# Patient Record
Sex: Female | Born: 1945 | Race: White | Hispanic: No | Smoking: Former smoker
Health system: Southern US, Community
[De-identification: ages and names within clinical notes are randomized; demographics above are authoritative.]

## PROBLEM LIST (undated history)

## (undated) DIAGNOSIS — R32 Unspecified urinary incontinence: Secondary | ICD-10-CM

## (undated) DIAGNOSIS — R918 Other nonspecific abnormal finding of lung field: Secondary | ICD-10-CM

## (undated) DIAGNOSIS — G5600 Carpal tunnel syndrome, unspecified upper limb: Secondary | ICD-10-CM

## (undated) DIAGNOSIS — R42 Dizziness and giddiness: Secondary | ICD-10-CM

## (undated) DIAGNOSIS — I499 Cardiac arrhythmia, unspecified: Secondary | ICD-10-CM

## (undated) DIAGNOSIS — I447 Left bundle-branch block, unspecified: Secondary | ICD-10-CM

## (undated) DIAGNOSIS — R112 Nausea with vomiting, unspecified: Secondary | ICD-10-CM

## (undated) DIAGNOSIS — Z8489 Family history of other specified conditions: Secondary | ICD-10-CM

## (undated) DIAGNOSIS — J449 Chronic obstructive pulmonary disease, unspecified: Secondary | ICD-10-CM

## (undated) DIAGNOSIS — R0600 Dyspnea, unspecified: Secondary | ICD-10-CM

## (undated) DIAGNOSIS — T4145XA Adverse effect of unspecified anesthetic, initial encounter: Secondary | ICD-10-CM

## (undated) DIAGNOSIS — Z8719 Personal history of other diseases of the digestive system: Secondary | ICD-10-CM

## (undated) DIAGNOSIS — I4891 Unspecified atrial fibrillation: Secondary | ICD-10-CM

## (undated) DIAGNOSIS — R0609 Other forms of dyspnea: Secondary | ICD-10-CM

## (undated) DIAGNOSIS — N6019 Diffuse cystic mastopathy of unspecified breast: Secondary | ICD-10-CM

## (undated) DIAGNOSIS — T8859XA Other complications of anesthesia, initial encounter: Secondary | ICD-10-CM

## (undated) DIAGNOSIS — J189 Pneumonia, unspecified organism: Secondary | ICD-10-CM

## (undated) DIAGNOSIS — I10 Essential (primary) hypertension: Secondary | ICD-10-CM

## (undated) DIAGNOSIS — M1990 Unspecified osteoarthritis, unspecified site: Secondary | ICD-10-CM

## (undated) DIAGNOSIS — D1803 Hemangioma of intra-abdominal structures: Secondary | ICD-10-CM

## (undated) DIAGNOSIS — G5682 Other specified mononeuropathies of left upper limb: Secondary | ICD-10-CM

## (undated) DIAGNOSIS — K219 Gastro-esophageal reflux disease without esophagitis: Secondary | ICD-10-CM

## (undated) HISTORY — DX: Dizziness and giddiness: R42

## (undated) HISTORY — PX: KNEE ARTHROSCOPY: SUR90

## (undated) HISTORY — PX: OTHER SURGICAL HISTORY: SHX169

## (undated) HISTORY — PX: ABDOMINAL HYSTERECTOMY: SHX81

## (undated) HISTORY — PX: COLONOSCOPY: SHX174

## (undated) HISTORY — PX: DIAGNOSTIC LAPAROSCOPY: SUR761

## (undated) HISTORY — PX: TUBAL LIGATION: SHX77

---
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
## 1975-12-23 HISTORY — PX: TYMPANOSTOMY TUBE PLACEMENT: SHX32

## 1977-11-21 HISTORY — PX: HEMORROIDECTOMY: SUR656

## 1986-04-21 HISTORY — PX: VAGINAL HYSTERECTOMY: SHX2639

## 1997-12-22 HISTORY — PX: KNEE SURGERY: SHX244

## 2004-12-22 HISTORY — PX: BREAST BIOPSY: SHX20

## 2005-06-27 ENCOUNTER — Ambulatory Visit: Payer: Self-pay | Admitting: General Surgery

## 2005-11-09 ENCOUNTER — Other Ambulatory Visit: Payer: Self-pay

## 2005-11-09 ENCOUNTER — Inpatient Hospital Stay: Payer: Self-pay | Admitting: Internal Medicine

## 2005-11-21 HISTORY — PX: LARYNGOSCOPY: SUR817

## 2005-11-28 ENCOUNTER — Ambulatory Visit: Payer: Self-pay | Admitting: Internal Medicine

## 2005-12-09 ENCOUNTER — Ambulatory Visit: Payer: Self-pay | Admitting: Internal Medicine

## 2005-12-12 ENCOUNTER — Ambulatory Visit: Payer: Self-pay | Admitting: Internal Medicine

## 2005-12-24 ENCOUNTER — Ambulatory Visit: Payer: Self-pay | Admitting: Otolaryngology

## 2006-01-06 ENCOUNTER — Ambulatory Visit: Payer: Self-pay | Admitting: Internal Medicine

## 2006-01-09 ENCOUNTER — Ambulatory Visit: Payer: Self-pay | Admitting: Internal Medicine

## 2006-04-06 ENCOUNTER — Ambulatory Visit: Payer: Self-pay | Admitting: Internal Medicine

## 2006-04-09 ENCOUNTER — Ambulatory Visit: Payer: Self-pay | Admitting: Internal Medicine

## 2006-04-21 ENCOUNTER — Ambulatory Visit: Payer: Self-pay | Admitting: Internal Medicine

## 2006-06-04 ENCOUNTER — Ambulatory Visit: Payer: Self-pay | Admitting: Obstetrics and Gynecology

## 2006-08-05 ENCOUNTER — Ambulatory Visit: Payer: Self-pay | Admitting: Internal Medicine

## 2006-08-13 ENCOUNTER — Ambulatory Visit: Payer: Self-pay | Admitting: Internal Medicine

## 2006-08-22 ENCOUNTER — Ambulatory Visit: Payer: Self-pay | Admitting: Internal Medicine

## 2007-02-15 ENCOUNTER — Ambulatory Visit: Payer: Self-pay | Admitting: Internal Medicine

## 2007-02-20 ENCOUNTER — Ambulatory Visit: Payer: Self-pay | Admitting: Internal Medicine

## 2007-03-23 ENCOUNTER — Ambulatory Visit: Payer: Self-pay | Admitting: Internal Medicine

## 2007-06-09 ENCOUNTER — Ambulatory Visit: Payer: Self-pay

## 2007-07-26 ENCOUNTER — Emergency Department: Payer: Self-pay | Admitting: Emergency Medicine

## 2007-07-26 ENCOUNTER — Other Ambulatory Visit: Payer: Self-pay

## 2007-10-20 ENCOUNTER — Ambulatory Visit: Payer: Self-pay | Admitting: Gastroenterology

## 2008-01-19 ENCOUNTER — Other Ambulatory Visit: Payer: Self-pay

## 2008-01-19 ENCOUNTER — Ambulatory Visit: Payer: Self-pay | Admitting: Urology

## 2008-01-24 ENCOUNTER — Inpatient Hospital Stay: Payer: Self-pay | Admitting: Obstetrics and Gynecology

## 2008-01-24 HISTORY — PX: BLADDER SUSPENSION: SHX72

## 2008-01-24 HISTORY — PX: HERNIA REPAIR: SHX51

## 2008-03-06 ENCOUNTER — Ambulatory Visit: Payer: Self-pay | Admitting: Internal Medicine

## 2008-06-12 ENCOUNTER — Ambulatory Visit: Payer: Self-pay | Admitting: Obstetrics and Gynecology

## 2008-08-09 ENCOUNTER — Inpatient Hospital Stay: Payer: Self-pay | Admitting: Internal Medicine

## 2008-08-09 ENCOUNTER — Other Ambulatory Visit: Payer: Self-pay

## 2008-08-16 ENCOUNTER — Ambulatory Visit: Payer: Self-pay | Admitting: Internal Medicine

## 2008-08-22 HISTORY — PX: CHOLECYSTECTOMY: SHX55

## 2008-09-05 ENCOUNTER — Ambulatory Visit: Payer: Self-pay | Admitting: Surgery

## 2009-06-14 ENCOUNTER — Ambulatory Visit: Payer: Self-pay | Admitting: Obstetrics and Gynecology

## 2010-03-22 ENCOUNTER — Ambulatory Visit: Payer: Self-pay | Admitting: Internal Medicine

## 2010-04-02 ENCOUNTER — Emergency Department: Payer: Self-pay | Admitting: Emergency Medicine

## 2010-05-31 ENCOUNTER — Emergency Department: Payer: Self-pay | Admitting: Unknown Physician Specialty

## 2010-06-19 ENCOUNTER — Ambulatory Visit: Payer: Self-pay | Admitting: Obstetrics and Gynecology

## 2010-06-29 ENCOUNTER — Ambulatory Visit: Payer: Self-pay | Admitting: Internal Medicine

## 2011-06-23 ENCOUNTER — Ambulatory Visit: Payer: Self-pay | Admitting: Obstetrics and Gynecology

## 2011-12-01 ENCOUNTER — Ambulatory Visit: Payer: Self-pay | Admitting: Internal Medicine

## 2012-06-29 ENCOUNTER — Ambulatory Visit: Payer: Self-pay | Admitting: Internal Medicine

## 2012-11-11 DIAGNOSIS — N3941 Urge incontinence: Secondary | ICD-10-CM | POA: Insufficient documentation

## 2012-11-11 DIAGNOSIS — N393 Stress incontinence (female) (male): Secondary | ICD-10-CM | POA: Insufficient documentation

## 2012-11-11 DIAGNOSIS — N319 Neuromuscular dysfunction of bladder, unspecified: Secondary | ICD-10-CM | POA: Insufficient documentation

## 2012-11-11 DIAGNOSIS — R339 Retention of urine, unspecified: Secondary | ICD-10-CM | POA: Insufficient documentation

## 2012-11-11 DIAGNOSIS — N816 Rectocele: Secondary | ICD-10-CM | POA: Insufficient documentation

## 2012-12-31 ENCOUNTER — Ambulatory Visit: Payer: Self-pay | Admitting: Gastroenterology

## 2013-01-16 ENCOUNTER — Emergency Department: Payer: Self-pay | Admitting: Internal Medicine

## 2013-02-16 ENCOUNTER — Ambulatory Visit: Payer: Self-pay | Admitting: Orthopedic Surgery

## 2013-06-08 ENCOUNTER — Ambulatory Visit: Payer: Self-pay | Admitting: Ophthalmology

## 2013-06-14 ENCOUNTER — Ambulatory Visit: Payer: Self-pay | Admitting: Ophthalmology

## 2013-06-14 HISTORY — PX: CATARACT EXTRACTION: SUR2

## 2013-06-28 ENCOUNTER — Ambulatory Visit: Payer: Self-pay | Admitting: Ophthalmology

## 2013-06-28 HISTORY — PX: CATARACT EXTRACTION: SUR2

## 2013-07-06 ENCOUNTER — Ambulatory Visit: Payer: Self-pay | Admitting: Internal Medicine

## 2013-09-15 ENCOUNTER — Ambulatory Visit: Payer: Self-pay | Admitting: Internal Medicine

## 2013-09-21 ENCOUNTER — Ambulatory Visit: Payer: Self-pay | Admitting: Internal Medicine

## 2013-11-21 ENCOUNTER — Ambulatory Visit: Payer: Self-pay | Admitting: Internal Medicine

## 2013-12-22 ENCOUNTER — Ambulatory Visit: Payer: Self-pay | Admitting: Internal Medicine

## 2014-07-18 ENCOUNTER — Ambulatory Visit: Payer: Self-pay | Admitting: Internal Medicine

## 2014-09-07 ENCOUNTER — Ambulatory Visit: Payer: Self-pay | Admitting: Podiatry

## 2014-09-07 HISTORY — PX: FOOT SURGERY: SHX648

## 2014-10-16 DIAGNOSIS — I4719 Other supraventricular tachycardia: Secondary | ICD-10-CM | POA: Insufficient documentation

## 2014-10-16 DIAGNOSIS — E782 Mixed hyperlipidemia: Secondary | ICD-10-CM | POA: Insufficient documentation

## 2014-10-16 DIAGNOSIS — D1803 Hemangioma of intra-abdominal structures: Secondary | ICD-10-CM | POA: Insufficient documentation

## 2014-10-16 DIAGNOSIS — I471 Supraventricular tachycardia: Secondary | ICD-10-CM | POA: Insufficient documentation

## 2014-10-16 DIAGNOSIS — K219 Gastro-esophageal reflux disease without esophagitis: Secondary | ICD-10-CM | POA: Insufficient documentation

## 2015-04-13 NOTE — Op Note (Signed)
PATIENT NAME:  Kimberly Page, Kimberly Page MR#:  [id number] DATE OF BIRTH:  1946-11-17  DATE OF PROCEDURE:  06/14/2013  PREOPERATIVE DIAGNOSIS: Visually significant cataract of the left eye.   POSTOPERATIVE DIAGNOSIS: Visually significant cataract of the left eye.   OPERATIVE PROCEDURE: Cataract extraction by phacoemulsification with implant of intraocular lens to left eye.   SURGEON: Birder Robson, MD.   ANESTHESIA:  1. Managed anesthesia care.  2. Topical tetracaine drops followed by 2% Xylocaine jelly applied in the preoperative holding area.   COMPLICATIONS: None.   TECHNIQUE:  Stop and chop.   DESCRIPTION OF PROCEDURE: The patient was examined and consented in the preoperative holding area where the aforementioned topical anesthesia was applied to the left eye and then brought back to the Operating Room where the left eye was prepped and draped in the usual sterile ophthalmic fashion and a lid speculum was placed. A paracentesis was created with the side port blade and the anterior chamber was filled with viscoelastic. A near clear corneal incision was performed with the steel keratome. A continuous curvilinear capsulorrhexis was performed with a cystotome followed by the capsulorrhexis forceps. Hydrodissection and hydrodelineation were carried out with BSS on a blunt cannula. The lens was removed in a stop and chop technique and the remaining cortical material was removed with the irrigation-aspiration handpiece. The capsular bag was inflated with viscoelastic and the Tecnis ZCB00 29.5-diopter lens, serial number [id number] was placed in the capsular bag without complication. The remaining viscoelastic was removed from the eye with the irrigation-aspiration handpiece. The wounds were hydrated. The anterior chamber was flushed with Miostat and the eye was inflated to physiologic pressure. 0.1 mL of cefuroxime concentration 10 mg/mL was placed in the anterior chamber. The wounds were found to be water  tight. The eye was dressed with Vigamox and Combigan. The patient was given protective glasses to wear throughout the day and a shield with which to sleep tonight. The patient was also given drops with which to begin a drop regimen today and will follow-up with me in one day.     ____________________________ Livingston Diones. Elenor Wildes, MD wlp:sb D: 06/14/2013 13:00:00 ET T: 06/14/2013 13:16:20 ET JOB#: [id number]  cc: Sebastin Perlmutter L. Aliese Brannum, MD, <Dictator> Livingston Diones Kayin Kettering MD ELECTRONICALLY SIGNED 06/16/2013 9:43

## 2015-10-22 ENCOUNTER — Other Ambulatory Visit: Payer: Self-pay | Admitting: Internal Medicine

## 2015-10-22 DIAGNOSIS — M5440 Lumbago with sciatica, unspecified side: Secondary | ICD-10-CM

## 2015-11-02 ENCOUNTER — Ambulatory Visit
Admission: RE | Admit: 2015-11-02 | Discharge: 2015-11-02 | Disposition: A | Discharge status: Home / Self Care | Payer: Medicare Other | Source: Ambulatory Visit | Attending: Internal Medicine | Admitting: Internal Medicine

## 2015-11-02 DIAGNOSIS — M5116 Intervertebral disc disorders with radiculopathy, lumbar region: Secondary | ICD-10-CM | POA: Diagnosis present

## 2015-11-02 DIAGNOSIS — M4687 Other specified inflammatory spondylopathies, lumbosacral region: Secondary | ICD-10-CM | POA: Diagnosis not present

## 2015-11-02 DIAGNOSIS — M5440 Lumbago with sciatica, unspecified side: Secondary | ICD-10-CM

## 2015-11-21 DIAGNOSIS — D510 Vitamin B12 deficiency anemia due to intrinsic factor deficiency: Secondary | ICD-10-CM | POA: Insufficient documentation

## 2016-05-06 ENCOUNTER — Other Ambulatory Visit: Payer: Self-pay | Admitting: Internal Medicine

## 2016-05-06 DIAGNOSIS — M5116 Intervertebral disc disorders with radiculopathy, lumbar region: Secondary | ICD-10-CM

## 2016-05-26 ENCOUNTER — Ambulatory Visit
Admission: RE | Admit: 2016-05-26 | Discharge: 2016-05-26 | Disposition: A | Discharge status: Home / Self Care | Payer: Medicare Other | Source: Ambulatory Visit | Attending: Internal Medicine | Admitting: Internal Medicine

## 2016-05-26 DIAGNOSIS — M5116 Intervertebral disc disorders with radiculopathy, lumbar region: Secondary | ICD-10-CM | POA: Insufficient documentation

## 2016-06-04 ENCOUNTER — Encounter: Payer: Self-pay | Admitting: Neurology

## 2016-06-04 ENCOUNTER — Ambulatory Visit (INDEPENDENT_AMBULATORY_CARE_PROVIDER_SITE_OTHER): Payer: Medicare Other | Admitting: Neurology

## 2016-06-04 VITALS — BP 115/62 | HR 61 | Resp 12 | Wt 176.2 lb

## 2016-06-04 DIAGNOSIS — M5416 Radiculopathy, lumbar region: Secondary | ICD-10-CM

## 2016-06-04 DIAGNOSIS — M5417 Radiculopathy, lumbosacral region: Secondary | ICD-10-CM | POA: Diagnosis not present

## 2016-06-04 NOTE — Progress Notes (Addendum)
[facility]    Provider:  Dr Jaynee Eagles Referring Provider: Rusty Aus, MD Primary Care Physician:  Rusty Aus, MD  CC:  Right leg pain  HPI:  Kimberly Page is a 70 y.o. female here as a referral from Dr. Sabra Heck for right leg pain. PMHx CTS, COPD, Tobacco use, chronic leg pain. She had foot surgery in 2015 and since then she has numbness in the foot from the surgery and pain shooting through the toes and cramps. On the dorsal surface, 3/4 of the top of the foot is numb. MIddle and lower back pain. Sitting for a long time trouble getting up. Numbness in butt when sitting. tingling in the groin. Leg cramps. A neurologist told her they damaged her sciatic nerve on lumbar puncture before the foot surgery. Calf sore. She has a sore on her anterior lower leg and says this is painful. The back pain stays local in the back no radicular symptoms. If she stands too long like making dinner her low back hurts. Cramps in the legs in bed and when sitting in recliner. She a;leady had 2 emg/ncs. She went to [facility] neurology clinic.   Reviewed notes, labs and imaging from outside physicians, which showed:  Personally reviewed images of MRi of the lumbar spine and agree with the following: 05/26/2016: T10-11: Minimal bulge.  T11-12: Small Schmorl's node deformity. Mild bulge. Mild narrowing ventral thecal sac. Minimal posterior cord deflection.  T12-L1: Minimal retrolisthesis. Minimal Schmorl's node deformity. Minimal bulge.  L1-2: Minimal Schmorl's node deformity. Minimal bulge.  L2-3: Minimal bulge. Minimal facet degenerative changes.  L3-4: Mild to moderate facet degenerative changes. Bulge. Minimal anterior slips L3. Minimal spinal stenosis.  L4-5: Moderate facet degenerative changes and ligamentum flavum hypertrophy greater on the left. Bulge slightly greater to left. Multifactorial mild spinal stenosis. Mild foraminal narrowing greater on the left.  L5-S1:  Moderate facet degenerative changes. Minimal bulge. No significant spinal stenosis or foraminal narrowing.  IMPRESSION: No significant change from prior examination as detailed above. Overall, no significant right-sided nerve root compression or high-grade spinal stenosis detected as cause of patient's symptoms.  Review of Systems: Patient complains of symptoms per HPI as well as the following symptoms: No fevers, no chills. Endorses leg pain and numbness. Pertinent negatives per HPI. All others negative.   Social History   Social History  . Marital Status: Widowed    Spouse Name: N/A  . Number of Children: 1  . Years of Education: 12   Occupational History  . [employer]    Social History Main Topics  . Smoking status: Former Smoker    Quit date: 12/23/1999  . Smokeless tobacco: Not on file  . Alcohol Use: No  . Drug Use: No  . Sexual Activity: Not on file   Other Topics Concern  . Not on file   Social History Narrative   Lives alone   Caffeine use:  none    Family History  Problem Relation Age of Onset  . Cancer      Past Medical History  Diagnosis Date  . Vertigo     Past Surgical History  Procedure Laterality Date  . Foot surgery Right 09/07/14  . Cataract extraction Left 06/14/2013  . Cataract extraction Right 06/28/2013  . Cholecystectomy  08/2008  . Hernia repair  01/24/2008  . Bladder suspension  01/24/2008  . Laryngoscopy  11/2005  . Breast biopsy Left 12/2004  . Knee surgery Left 12/1997  . Vaginal hysterectomy  04/1986  .  Hemorroidectomy  11/1977  . Tympanostomy tube placement  1977  . Eye surgery Right 1958    Current Outpatient Prescriptions  Medication Sig Dispense Refill  . aspirin 81 MG tablet Take 81 mg by mouth daily.    Marland Kitchen CALCIUM-VITAMIN D PO Take 600 mg by mouth 2 (two) times daily.    . Cyanocobalamin (VITAMIN B-12 PO) Take 1 tablet by mouth daily.    . Magnesium 250 MG TABS Take 1 tablet by mouth daily. With Lunch    .  meclizine (ANTIVERT) 25 MG tablet Take 25 mg by mouth as needed for dizziness.    . metoprolol succinate (TOPROL-XL) 50 MG 24 hr tablet Take 50 mg by mouth 2 (two) times daily. Take with or immediately following a meal. Per patient: 1 tab in morning and 1 tab at night    . oxybutynin (DITROPAN) 5 MG tablet Take 5 mg by mouth 2 (two) times daily.    . pantoprazole (PROTONIX) 40 MG tablet Take 40 mg by mouth daily.    . vitamin E 200 UNIT capsule Take 200 Units by mouth 4 (four) times a week.     No current facility-administered medications for this visit.    Allergies as of 06/04/2016 - Review Complete 06/04/2016  Allergen Reaction Noted  . Naproxen Other (See Comments) 06/04/2016  . Propoxyphene Other (See Comments) 06/04/2016  . Pseudoephedrine hcl Palpitations 06/04/2016    Vitals: BP 115/62 mmHg  Pulse 61  Wt 176 lb 3.2 oz (79.924 kg)  SpO2 95% Last Weight:  Wt Readings from Last 1 Encounters:  06/04/16 176 lb 3.2 oz (79.924 kg)   Last Height:   Ht Readings from Last 1 Encounters:  No data found for Ht    Physical exam: Exam: Gen: NAD, conversant, well nourised, obese, well groomed                     CV: RRR, no MRG. No Carotid Bruits. No peripheral edema, warm, nontender Eyes: Conjunctivae clear without exudates or hemorrhage  Neuro: Detailed Neurologic Exam  Speech:    Speech is normal; fluent and spontaneous with normal comprehension.  Cognition:    The patient is oriented to person, place, and time;     recent and remote memory intact;     language fluent;     normal attention, concentration,     fund of knowledge Cranial Nerves:    The pupils are equal, round, and reactive to light. Attempted fundoscopic examBut could not visualize due to small pupils.  Visual fields are full to finger confrontation. Extraocular movements are intact. Trigeminal sensation is intact and the muscles of mastication are normal. The face is symmetric. The palate elevates in the  midline. Hearing intact. Voice is normal. Shoulder shrug is normal. The tongue has normal motion without fasciculations.   Coordination:    Normal finger to nose and heel to shin.  Gait:    Heel-toe with some imbalance.   Motor Observation:    No asymmetry, no atrophy, and no involuntary movements noted. Tone:    Normal muscle tone.    Posture:    Posture is normal. normal erect    Strength: right dorsiflexion weakness, had to encourage patient to give me full strength becaue she said touching it there hurt. Otherwise  Strength is V/V in the upper and lower limbs.      Sensation: decreased pin prick right foot dorsal surface and lateral right foot and lower leg.  Reflex Exam:  DTR's: right absent ankle jerk otherwise deep tendon reflexes in the upper and lower extremities are normal bilaterally.   Toes:    The toes are downgoing bilaterally.   Clonus:    Clonus is absent.      Assessment/Plan:  70 year old female with chronic right leg weakness and sensory changes. She has sensory changes in the right dorsal surface of the foot and right lateral flex and lateral lower leg, mild weakness of dorsiflexion, absent right ankle jerk. Exam onsistent with possibly remote L5-S1 nerve root impingement. Patient has been extensively evaluated by another neurologist and had multiple EMG nerve conduction studies. I can request her records and review.  - she has low back pain without radiculopathy, she has degenerative disease on the last MRI of the lumbar spine  which may be causing the low back pain, she is seeing an orthopaedist hopefully they can do facet blocks or other pain procedures. - her foot numbness maybe due to local nerve injury as it happened right after foot surgery or due to radiculopathy.  - cannot rule out sciatic involvement but she has already had imaging, emg/ncs x 2. -  Patient has been extensively evaluated by another neurologist and had multiple EMG nerve conduction  studies. I can request her records and review. If there is any more that I can offer patient after I review all the records I will contact patient.    Reviewed notes from previous neurologist at [facility]: EMG nerve conduction study was completed May 2016: Nerve conductions were completed on the bilateral lower extremities and EMG in the right lower extremity. Evaluation of the right tibial motor nerve showed decreased conduction velocity 40 m/s needed. The right sural sensory nerve showed prolonged distal peak latency 4.2 ms and decreased conduction velocity 33 m/s To lateral malleolus. All remaining nerves were normal . EMG of the right gastroc showed mildly decreased recruitment, anterior tibialis showed mildly decreased recruitment in, gluteus maximus and, biceps femoris (long and Alcalde head) with a long #mildly decreased recruitment, peroneus longus showed mildly decreased recruitment, lumbosacral paraspinal lower and mid muscles L3-L4 and L5-S1 did not show acute ongoing denervation; consistent with right sciatic neuropathy leg proximal thigh, no evidence of lumbar radiculopathy  Repeat EMG nerve conduction study in October 2016 showed interval improvement in right sciatic neuropathy. 01/26/2015, resolution of neuropathic changes on EMG in the right anterior tibialis, gastrocnemius and Wenker head of the biceps femoris.  The patient was also seen in clinic: The Patient has known B12 deficiency 187 in October 2016 and is receiving B12 injections. Patient was seen in clinic due to her right foot numbness since September 2013 following surgery consisting of right foot fifth metatarsal osteotomy and hallux valgus repair with general anesthesia popliteal block. II nerve conduction studies since then were completed. The first showed right-sided sciatic nerve changes with changes in the right foot with toe curling. The second nerve conduction study. Improvement in the gastrocnemius tibialis anterior. Subjectively  patient still has a lot of pain for which she occasionally takes Aleve) and she has been in her feet a lot a particular day. She also endorses numbness weakness and foot cramps at night if she flexes them. Suddenly and slowly getting worse. Taking her she walked makes her symptoms worse. She is not diabetic and does not have hypothyroidism. During the exam with physician which was 11/11/2015 patient became emotional and began crying. She was crying due to the many symptoms after surgery to relieve her  foot trouble. She was also feeling depressed due to her recent family death. Exam was significant for absent right shoulder left ankle jerk 1+. Right toe curling 45 with pain, V sensation of the right foot dorsal surface, sensations were decreased in the bilateral lower extremities, decreased vibration, temperature sense and proprioception. Gait and station are wide based and mildly antalgic, positive Romberg.  MRI of the lumbar spine showed slightly progressed moderately severe bilateral facet arthritis at L4-5 S1, chronic left lateral disc bulge at L4-L5 with out neural impingement, moderate bilateral facet arthritis at L4-L5 slightly progressed. MRI was completed November 2016.  Gabapentin was suggested. Patient felt as though her symptoms were not bad in declined. B12 injections were best follow-up in 2 months for numbness and tingling. No further workup was recommended. She was last seen in February 2017. She was diagnosed with right sciatic neuropathy, focal mononeuropathy which was improved and repeat EMG nerve conduction study and no further workup was recommended. Neurontin was recommended. She was asked to return if symptoms worsen or fail to improve.  Sarina Ill, MD  [facility] [address] [place],  [address]  Phone [phone] Fax [phone]

## 2016-06-04 NOTE — Patient Instructions (Signed)

## 2016-06-05 DIAGNOSIS — M5417 Radiculopathy, lumbosacral region: Secondary | ICD-10-CM

## 2016-06-06 ENCOUNTER — Telehealth: Payer: Self-pay | Admitting: *Deleted

## 2016-06-06 NOTE — Telephone Encounter (Signed)
Patient returned Kimberly Page's call.

## 2016-06-06 NOTE — Telephone Encounter (Signed)
A record request faxed over to [facility] requesting all records.

## 2016-07-15 NOTE — Telephone Encounter (Signed)
Received medical records from  [facility] desk.

## 2016-09-09 ENCOUNTER — Encounter: Payer: Self-pay | Admitting: Emergency Medicine

## 2016-09-09 ENCOUNTER — Emergency Department
Admission: EM | Admit: 2016-09-09 | Discharge: 2016-09-09 | Disposition: A | Discharge status: Home / Self Care | Payer: Medicare Other | Attending: Emergency Medicine | Admitting: Emergency Medicine

## 2016-09-09 ENCOUNTER — Emergency Department: Payer: Medicare Other

## 2016-09-09 DIAGNOSIS — R0789 Other chest pain: Secondary | ICD-10-CM

## 2016-09-09 DIAGNOSIS — Z79899 Other long term (current) drug therapy: Secondary | ICD-10-CM | POA: Insufficient documentation

## 2016-09-09 DIAGNOSIS — Z7982 Long term (current) use of aspirin: Secondary | ICD-10-CM | POA: Diagnosis not present

## 2016-09-09 DIAGNOSIS — Z87891 Personal history of nicotine dependence: Secondary | ICD-10-CM | POA: Insufficient documentation

## 2016-09-09 DIAGNOSIS — R067 Sneezing: Secondary | ICD-10-CM

## 2016-09-09 NOTE — ED Provider Notes (Addendum)
[facility] Emergency Department Provider Note  ____________________________________________   I have reviewed the triage vital signs and the nursing notes.   HISTORY  Chief Complaint Chest Pain    HPI Kimberly Page is a 70 y.o. female who presents today complaining of pain in the left chest wall after sneezing. She woke up and had a sneezing fit she states that sometimes she does this when she wakes up. Immediately after stating she noticed that she had "pulled muscle" in the left chest wall. She has tenderness to movement and palpation left chest wall. She denies shortness of breath nausea vomiting she denies any other discomfort. She has pain in the left chest wall and in the trapezius muscle. This began with a violent sneeze and she does not feel that she is having difficulty getting air in and out The pain is sharp, stating still makes it better, moving the wrong way or taking a deep breath or otherwise moving those muscles makes it worse. This started at approximately 6:15 this morning when she first woke up.     Past Medical History:  Diagnosis Date  . Vertigo     Patient Active Problem List   Diagnosis Date Noted  . Lumbosacral radiculopathy 06/05/2016    Past Surgical History:  Procedure Laterality Date  . BLADDER SUSPENSION  01/24/2008  . BREAST BIOPSY Left 12/2004  . CATARACT EXTRACTION Left 06/14/2013  . CATARACT EXTRACTION Right 06/28/2013  . CHOLECYSTECTOMY  08/2008  . EYE SURGERY Right 1958  . FOOT SURGERY Right 09/07/14  . HEMORROIDECTOMY  11/1977  . HERNIA REPAIR  01/24/2008  . KNEE SURGERY Left 12/1997  . LARYNGOSCOPY  11/2005  . TYMPANOSTOMY TUBE PLACEMENT  1977  . VAGINAL HYSTERECTOMY  04/1986    Prior to Admission medications   Medication Sig Start Date End Date Taking? Authorizing Provider  aspirin 81 MG tablet Take 81 mg by mouth daily.    Historical Provider, MD  CALCIUM-VITAMIN D PO Take 600 mg by mouth 2 (two) times  daily.    Historical Provider, MD  Cyanocobalamin (VITAMIN B-12 PO) Take 1 tablet by mouth daily.    Historical Provider, MD  Magnesium 250 MG TABS Take 1 tablet by mouth daily. With Lunch    Historical Provider, MD  meclizine (ANTIVERT) 25 MG tablet Take 25 mg by mouth as needed for dizziness.    Historical Provider, MD  metoprolol succinate (TOPROL-XL) 50 MG 24 hr tablet Take 50 mg by mouth 2 (two) times daily. Take with or immediately following a meal. Per patient: 1 tab in morning and 1 tab at night    Historical Provider, MD  oxybutynin (DITROPAN) 5 MG tablet Take 5 mg by mouth 2 (two) times daily.    Historical Provider, MD  pantoprazole (PROTONIX) 40 MG tablet Take 40 mg by mouth daily.    Historical Provider, MD  vitamin E 200 UNIT capsule Take 200 Units by mouth 4 (four) times a week.    Historical Provider, MD    Allergies Naproxen; Propoxyphene; and Pseudoephedrine hcl  Family History  Problem Relation Age of Onset  . Cancer      Social History Social History  Substance Use Topics  . Smoking status: Former Smoker    Quit date: 12/23/1999  . Smokeless tobacco: Never Used  . Alcohol use No    Review of Systems Constitutional: No fever/chills Eyes: No visual changes. ENT: No sore throat. No stiff neck no neck pain Cardiovascular: See history of  present illness Respiratory: Denies shortness of breath. Gastrointestinal:   no vomiting.  No diarrhea.  No constipation. Genitourinary: Negative for dysuria. Musculoskeletal: Negative lower extremity swelling Skin: Negative for rash. Neurological: Negative for severe headaches, focal weakness or numbness. 10-point ROS otherwise negative.  ____________________________________________   PHYSICAL EXAM:  VITAL SIGNS: ED Triage Vitals  Enc Vitals Group     BP 09/09/16 0643 (!) 176/73     Pulse Rate 09/09/16 0643 77     Resp 09/09/16 0643 18     Temp 09/09/16 0643 97.7 F (36.5 C)     Temp Source 09/09/16 0643 Oral      SpO2 09/09/16 0643 98 %     Weight 09/09/16 0644 175 lb (79.4 kg)     Height 09/09/16 0644 5\' 5"  (1.651 m)     Head Circumference --      Peak Flow --      Pain Score --      Pain Loc --      Pain Edu? --      Excl. in [place]? --     Constitutional: Alert and oriented. Well appearing and in no acute distress. Eyes: Conjunctivae are normal. PERRL. EOMI. Head: Atraumatic. Nose: No congestion/rhinnorhea. Mouth/Throat: Mucous membranes are moist.  Oropharynx non-erythematous. Neck: No stridor.   Nontender with no meningismus Cardiovascular: Normal rate, regular rhythm. Grossly normal heart sounds.  Good peripheral circulation. Chest: Tender to palpation left chest wall and I palpate this area patient states "ouch that's the pain right there" and pulls back. There is no flail chest there is no rib fracture palpated. There is no crepitus. Patient also has reproducible mild tenderness to the mid belly of the trapezius muscle on the left as well. She also has minimal discomfort when pulling up in the bed but is able to do so on the left. Respiratory: Normal respiratory effort.  No retractions. Lungs CTAB. Abdominal: Soft and nontender. No distention. No guarding no rebound Back:  There is no focal tenderness or step off.  there is no midline tenderness there are no lesions noted. there is no CVA tenderness Musculoskeletal: No lower extremity tenderness, no upper extremity tenderness. No joint effusions, no DVT signs strong distal pulses no edema Neurologic:  Normal speech and language. No gross focal neurologic deficits are appreciated.  Skin:  Skin is warm, dry and intact. No rash noted. Psychiatric: Mood and affect are normal. Speech and behavior are normal.  ____________________________________________   LABS (all labs ordered are listed, but only abnormal results are displayed)  Labs Reviewed  BASIC METABOLIC PANEL  CBC  TROPONIN I    ____________________________________________  EKG  I personally interpreted any EKGs ordered by me or triage Normal sinus rhythm rate 79 bpm no acute ST elevation or acute ST depression LAD noted. No acute ischemia indicated ____________________________________________  RADIOLOGY  I reviewed any imaging ordered by me or triage that were performed during my shift and, if possible, patient and/or family made aware of any abnormal findings. ____________________________________________   PROCEDURES  Procedure(s) performed: None  Procedures  Critical Care performed: None  ____________________________________________   INITIAL IMPRESSION / ASSESSMENT AND PLAN / ED COURSE  Pertinent labs & imaging results that were available during my care of the patient were reviewed by me and considered in my medical decision making (see chart for details). Patient presents with very reproducible chest wall pain after a sneeze. Patient does not appear to be otherwise ill. Pneumothorax is certainly a consideration for this  pathology but she has good breath sounds bilaterally no evidence respiratory distress at this time. We will obtain a PA and lateral chest x-ray to evaluate for that entity. One could also have Boerhaave's or other intrathoracic viscous disruption but again low suspicion given the patient's presentation. Her presentation is really most consistent with pulled muscles. However, we will evaluate the PA and lateral chest x-ray and reassess. The patient is in no acute distress. Unfortunately there is documented a allergy to naproxen. It made her "pass out" according to the note. Difficult to understand what that might mean, but we will avoid that medication.   ----------------------------------------- 8:02 AM on 09/09/2016 -----------------------------------------  At this time, there does not appear to be clinical evidence to support the diagnosis of pulmonary embolus, dissection,  myocarditis, endocarditis, pericarditis, pericardial tamponade, acute coronary syndrome, pneumothorax, pneumonia, or any other acute intrathoracic pathology that will require admission or acute intervention. Nor is there evidence of any significant intra-abdominal pathology causing this discomfort. Patient very well-appearing laughing and joking with me in the room. She does take Tylenol and Aleve at home for pain. She does not wish anything for pain right now. She will take her home pain medications when necessary. I do not think the patient requires further workup for this but return precautions and follow-up given and understood.  Clinical Course   ____________________________________________   FINAL CLINICAL IMPRESSION(S) / ED DIAGNOSES  Final diagnoses:  None      This chart was dictated using voice recognition software.  Despite best efforts to proofread,  errors can occur which can change meaning.      Schuyler Amor, MD 09/09/16 [id number]    Schuyler Amor, MD 09/09/16 [id number]    Schuyler Amor, MD 09/09/16 [phone]

## 2016-09-09 NOTE — ED Notes (Signed)
Pt reports left sided chest and back pain since this am

## 2016-09-09 NOTE — ED Triage Notes (Signed)
Pt presents to ED with c/o generalized chest tightness with shortness of breath and nausea. Pt states she woke up this morning got up to start her day. Pt states she sneezed 4-5 times and had a sudden onset of chest tightness and pain to her left shoulder. Pain increases when taking a deep breath. Pt currently has slight increased work of breathing noted at this time.

## 2016-09-25 ENCOUNTER — Emergency Department: Payer: Medicare Other

## 2016-09-25 ENCOUNTER — Emergency Department
Admission: EM | Admit: 2016-09-25 | Discharge: 2016-09-25 | Disposition: A | Discharge status: Home / Self Care | Payer: Medicare Other | Attending: Emergency Medicine | Admitting: Emergency Medicine

## 2016-09-25 ENCOUNTER — Encounter: Payer: Self-pay | Admitting: Emergency Medicine

## 2016-09-25 DIAGNOSIS — W19XXXA Unspecified fall, initial encounter: Secondary | ICD-10-CM

## 2016-09-25 DIAGNOSIS — Y990 Civilian activity done for income or pay: Secondary | ICD-10-CM | POA: Insufficient documentation

## 2016-09-25 DIAGNOSIS — Z79899 Other long term (current) drug therapy: Secondary | ICD-10-CM | POA: Insufficient documentation

## 2016-09-25 DIAGNOSIS — Z7982 Long term (current) use of aspirin: Secondary | ICD-10-CM | POA: Diagnosis not present

## 2016-09-25 DIAGNOSIS — W1839XA Other fall on same level, initial encounter: Secondary | ICD-10-CM | POA: Insufficient documentation

## 2016-09-25 DIAGNOSIS — M25551 Pain in right hip: Secondary | ICD-10-CM

## 2016-09-25 DIAGNOSIS — Y9269 Other specified industrial and construction area as the place of occurrence of the external cause: Secondary | ICD-10-CM | POA: Insufficient documentation

## 2016-09-25 DIAGNOSIS — S8991XA Unspecified injury of right lower leg, initial encounter: Secondary | ICD-10-CM | POA: Diagnosis present

## 2016-09-25 DIAGNOSIS — S82044A Nondisplaced comminuted fracture of right patella, initial encounter for closed fracture: Secondary | ICD-10-CM | POA: Insufficient documentation

## 2016-09-25 DIAGNOSIS — Z87891 Personal history of nicotine dependence: Secondary | ICD-10-CM | POA: Insufficient documentation

## 2016-09-25 DIAGNOSIS — S82001A Unspecified fracture of right patella, initial encounter for closed fracture: Secondary | ICD-10-CM

## 2016-09-25 DIAGNOSIS — Y9389 Activity, other specified: Secondary | ICD-10-CM | POA: Diagnosis not present

## 2016-09-25 MED ORDER — HYDROCODONE-ACETAMINOPHEN 5-325 MG PO TABS: 1.0000 | ORAL_TABLET | ORAL | 0 refills | Status: DC | PRN

## 2016-09-25 NOTE — ED Triage Notes (Signed)
Pt presents to ED after falling at work today. Pt denies filing worker's compensation claim. Pt reports right foot numbness due to surgery in 2015 and her foot got caught in chair. Pt fell out of chair hitting head, right hip and right knee. Pt states she fell onto carpeted concrete floor. Pt denies LOC.

## 2016-09-25 NOTE — ED Notes (Signed)
Patient is complaining of right knee and hip pain post fall.  Patient has slight abrasion to right knee.  Patient denies dizziness, denies passing out.  Patient reports hitting her head during the fall.  Patient takes a baby aspirin daily.

## 2016-09-25 NOTE — ED Notes (Signed)
Crutches not placed on patient.  Pt refused due to she feels she will be safer with her walker at home.

## 2016-09-25 NOTE — ED Provider Notes (Signed)
[facility] Emergency Department Provider Note   ____________________________________________   None    (approximate)  I have reviewed the triage vital signs and the nursing notes.   HISTORY  Chief Complaint Fall and Hip Pain    HPI Elliett Adama Corea is a 70 y.o. female presents for evaluation and after falling at work today. Patient reports right foot numbness secondary to use surgery in 2015 and states that she got her foot caught in a chair. Patient reports falling out of the chair hitting her head and is complaining of right hip and right knee pain. Patient still states that she fell onto a carpeted concrete floor but denies any loss of consciousness.   Past Medical History:  Diagnosis Date  . Vertigo     Patient Active Problem List   Diagnosis Date Noted  . Lumbosacral radiculopathy 06/05/2016    Past Surgical History:  Procedure Laterality Date  . BLADDER SUSPENSION  01/24/2008  . BREAST BIOPSY Left 12/2004  . CATARACT EXTRACTION Left 06/14/2013  . CATARACT EXTRACTION Right 06/28/2013  . CHOLECYSTECTOMY  08/2008  . EYE SURGERY Right 1958  . FOOT SURGERY Right 09/07/14  . HEMORROIDECTOMY  11/1977  . HERNIA REPAIR  01/24/2008  . KNEE SURGERY Left 12/1997  . LARYNGOSCOPY  11/2005  . TYMPANOSTOMY TUBE PLACEMENT  1977  . VAGINAL HYSTERECTOMY  04/1986    Prior to Admission medications   Medication Sig Start Date End Date Taking? Authorizing Provider  albuterol (PROVENTIL HFA;VENTOLIN HFA) 108 (90 Base) MCG/ACT inhaler Inhale 2 puffs into the lungs every 6 (six) hours as needed for wheezing or shortness of breath.    Historical Provider, MD  aspirin 81 MG tablet Take 81 mg by mouth daily.    Historical Provider, MD  CALCIUM-VITAMIN D PO Take 600 mg by mouth 2 (two) times daily.    Historical Provider, MD  Cyanocobalamin (VITAMIN B-12 PO) Take 1 tablet by mouth daily. At lunch    Historical Provider, MD  Magnesium 250 MG TABS Take 1 tablet  by mouth daily. With Lunch    Historical Provider, MD  meclizine (ANTIVERT) 25 MG tablet Take 25 mg by mouth as needed for dizziness.    Historical Provider, MD  metoprolol succinate (TOPROL-XL) 50 MG 24 hr tablet Take 50 mg by mouth 2 (two) times daily. Take with or immediately following a meal. Per patient: 1 tab in morning and 1 tab at night    Historical Provider, MD  oxybutynin (DITROPAN) 5 MG tablet Take 5 mg by mouth 2 (two) times daily.    Historical Provider, MD  pantoprazole (PROTONIX) 40 MG tablet Take 40 mg by mouth daily.    Historical Provider, MD  vitamin E 200 UNIT capsule Take 200 Units by mouth 4 (four) times a week. Takes Sundays, Mondays, Tuesdays and Wednesdays.    Historical Provider, MD    Allergies Naproxen; Propoxyphene; and Pseudoephedrine hcl  Family History  Problem Relation Age of Onset  . Cancer      Social History Social History  Substance Use Topics  . Smoking status: Former Smoker    Quit date: 12/23/1999  . Smokeless tobacco: Never Used  . Alcohol use No    Review of Systems Constitutional: No fever/chills Eyes: No visual changes. ENT: No sore throat. Cardiovascular: Denies chest pain. Respiratory: Denies shortness of breath. Gastrointestinal: No abdominal pain.  No nausea, no vomiting.  No diarrhea.  No constipation. Genitourinary: Negative for dysuria. Musculoskeletal: Positive for right hip  and right knee pain. Skin: Positive for abrasion to right anterior knee. Neurological: Negative for headaches, focal weakness or numbness.  10-point ROS otherwise negative.  ____________________________________________   PHYSICAL EXAM:  VITAL SIGNS: ED Triage Vitals [09/25/16 1038]  Enc Vitals Group     BP (!) 161/83     Pulse Rate 61     Resp 18     Temp 97.9 F (36.6 C)     Temp Source Oral     SpO2 98 %     Weight 175 lb (79.4 kg)     Height 5\' 5"  (1.651 m)     Head Circumference      Peak Flow      Pain Score 4     Pain Loc       Pain Edu?      Excl. in [place]?     Constitutional: Alert and oriented. Well appearing and in no acute distress. Eyes: Conjunctivae are normal. PERRL. EOMI. Head: Minimally point tenderness to the posterior occipital area of her scalp. Mouth/Throat: Mucous membranes are moist.  Oropharynx non-erythematous. Neck: No stridor. Supple, full range of motion nontender. Musculoskeletal: Positive right knee tenderness with limited range of motion. No obvious effusion noted. No ecchymosis or bruising. Right hip with positive point tenderness. Straight leg raise limited secondary to pain. Neurologic:  Normal speech and language. No gross focal neurologic deficits are appreciated. No gait instability. Skin:  Skin is warm, dry With superficial abrasion noted to the anterior right knee. Psychiatric: Mood and affect are normal. Speech and behavior are normal.  ____________________________________________   LABS (all labs ordered are listed, but only abnormal results are displayed)  Labs Reviewed - No data to display ____________________________________________  EKG   ____________________________________________  RADIOLOGY  IMPRESSION:  1. There several possible fractures observed, notably in the lateral  patella, lateral tibial plateau, and questionably along the medial  tibial rim. Arguing against fracture is the presence of only a small  knee effusion. CT of the knee is recommended for further workup.   IMPRESSION:  No acute bone abnormality in the pelvis or right hip.    __ IMPRESSION: CT Scan  Nondisplaced and likely incomplete fracture through the periphery of  the lateral patellar facet. No other fracture is identified.    Mild degenerative disease about the knee.   __________________________________________   PROCEDURES  Procedure(s) performed: None  Procedures  Critical Care performed: No  ____________________________________________   INITIAL IMPRESSION /  ASSESSMENT AND PLAN / ED COURSE  Pertinent labs & imaging results that were available during my care of the patient were reviewed by me and considered in my medical decision making (see chart for details).  Incomplete patellar nondisplaced fracture. Rx given for Vicodin 5/325 and Naprosyn as needed for pain. Follow-up with Dr. Roland Rack next week. Patient voices no other emergency medical complaints this time. Discharged home with the immobilizer and crutches.  Clinical Course     ____________________________________________   FINAL CLINICAL IMPRESSION(S) / ED DIAGNOSES  Final diagnoses:  Fall, initial encounter  Closed nondisplaced fracture of right patella, unspecified fracture morphology, initial encounter      NEW MEDICATIONS STARTED DURING THIS VISIT:  New Prescriptions   No medications on file     Note:  This document was prepared using Dragon voice recognition software and may include unintentional dictation errors.   Arlyss Repress, PA-C 09/25/16 1406    Schuyler Amor, MD 09/25/16 [phone]

## 2016-09-25 NOTE — Discharge Instructions (Signed)
Call and schedule your appointment with orthopedics above. Weight-bearing as tolerated.

## 2016-09-25 NOTE — ED Notes (Signed)
Patient did not receive crutches.  She refused at this time.

## 2016-10-22 ENCOUNTER — Other Ambulatory Visit: Payer: Self-pay | Admitting: Internal Medicine

## 2016-10-22 DIAGNOSIS — M79604 Pain in right leg: Secondary | ICD-10-CM | POA: Insufficient documentation

## 2016-10-22 DIAGNOSIS — G8929 Other chronic pain: Secondary | ICD-10-CM | POA: Insufficient documentation

## 2016-10-22 DIAGNOSIS — Z1231 Encounter for screening mammogram for malignant neoplasm of breast: Secondary | ICD-10-CM

## 2016-12-10 ENCOUNTER — Ambulatory Visit
Admission: RE | Admit: 2016-12-10 | Discharge: 2016-12-10 | Disposition: A | Discharge status: Home / Self Care | Payer: Medicare Other | Source: Ambulatory Visit | Attending: Internal Medicine | Admitting: Internal Medicine

## 2016-12-10 DIAGNOSIS — Z1231 Encounter for screening mammogram for malignant neoplasm of breast: Secondary | ICD-10-CM | POA: Diagnosis present

## 2017-04-21 DIAGNOSIS — Z0000 Encounter for general adult medical examination without abnormal findings: Secondary | ICD-10-CM | POA: Insufficient documentation

## 2017-08-11 ENCOUNTER — Emergency Department: Payer: Medicare Other

## 2017-08-11 ENCOUNTER — Emergency Department
Admission: EM | Admit: 2017-08-11 | Discharge: 2017-08-11 | Disposition: A | Discharge status: Home / Self Care | Payer: Medicare Other | Attending: Student in an Organized Health Care Education/Training Program | Admitting: Student in an Organized Health Care Education/Training Program

## 2017-08-11 ENCOUNTER — Encounter: Payer: Self-pay | Admitting: *Deleted

## 2017-08-11 DIAGNOSIS — R079 Chest pain, unspecified: Secondary | ICD-10-CM | POA: Diagnosis present

## 2017-08-11 DIAGNOSIS — R42 Dizziness and giddiness: Secondary | ICD-10-CM | POA: Insufficient documentation

## 2017-08-11 DIAGNOSIS — Z79899 Other long term (current) drug therapy: Secondary | ICD-10-CM | POA: Diagnosis not present

## 2017-08-11 DIAGNOSIS — Z87891 Personal history of nicotine dependence: Secondary | ICD-10-CM | POA: Diagnosis not present

## 2017-08-11 LAB — CBC
HCT: 37.3 % (ref 35.0–47.0)
HEMOGLOBIN: 12.9 g/dL (ref 12.0–16.0)
MCH: 28.7 pg (ref 26.0–34.0)
MCHC: 34.7 g/dL (ref 32.0–36.0)
MCV: 82.7 fL (ref 80.0–100.0)
Platelets: 354 10*3/uL (ref 150–440)
RBC: 4.51 MIL/uL (ref 3.80–5.20)
RDW: 15.1 % — ABNORMAL HIGH (ref 11.5–14.5)
WBC: 9.2 10*3/uL (ref 3.6–11.0)

## 2017-08-11 LAB — BASIC METABOLIC PANEL
ANION GAP: 7 (ref 5–15)
BUN: 21 mg/dL — ABNORMAL HIGH (ref 6–20)
CALCIUM: 8.6 mg/dL — AB (ref 8.9–10.3)
CHLORIDE: 105 mmol/L (ref 101–111)
CO2: 23 mmol/L (ref 22–32)
Creatinine, Ser: 0.77 mg/dL (ref 0.44–1.00)
GFR calc non Af Amer: >60 mL/min (ref >60)
Glucose, Bld: 154 mg/dL — ABNORMAL HIGH (ref 65–99)
Potassium: 3.4 mmol/L — ABNORMAL LOW (ref 3.5–5.1)
Sodium: 135 mmol/L (ref 135–145)

## 2017-08-11 LAB — TROPONIN I: Troponin I: <0.03 ng/mL (ref <0.03)

## 2017-08-11 MED ORDER — ACETAMINOPHEN 325 MG PO TABS
650.0000 mg | ORAL_TABLET | Freq: Once | ORAL | Status: AC
  Administered 2017-08-11: 650 mg via ORAL
  Filled 2017-08-11: qty 2

## 2017-08-11 MED ORDER — HYDROCODONE-ACETAMINOPHEN 5-325 MG PO TABS: 1.0000 | ORAL_TABLET | Freq: Once | ORAL | Status: DC

## 2017-08-11 NOTE — ED Triage Notes (Signed)
Pt reports new onset of generalized chest tightness that began this afternoon. Pt reports feeling nasueous and "just not right." Pt reports she felt SOB earlier in the day and used home inhaler without relief. SOB has subsided at this time. Pt also reports feeling worried that dizziness would start and took meclizine (hx of vertigo). Pt denies dizziness at this time.

## 2017-08-11 NOTE — ED Provider Notes (Signed)
[facility] Emergency Department Provider Note    First MD Initiated Contact with Patient 08/11/17 1628     (approximate)  I have reviewed the triage vital signs and the nursing notes.   HISTORY  Chief Complaint Chest Pain    HPI Zanobia Opha Mcghee is a 71 y.o. female presents with chief complaint of "funny feeling in her head and chest, or chest tightness and tingling in her fingers ". Symptoms started yesterday. Patient was worried that she was given a have a new onset of her vertigo so she took a meclizine. States she's been feeling this symptoms constantly. 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 States that around 11:00 the patient developed chest tightness. There is no pain radiating to her neck.  No nausea. No vomiting. No diaphoresis. This pain occurred while she was at rest. States that she still has some "soreness in her chest." States she does have a history of COPD and took a breathing treatment earlier in the day without any change in her symptoms. No fevers and no cough. No abdominal pain. No lower extremity swelling   Past Medical History:  Diagnosis Date  . Vertigo    Family History  Problem Relation Age of Onset  . Cancer Unknown   . Breast cancer Paternal Aunt   . Breast cancer Cousin    Past Surgical History:  Procedure Laterality Date  . BLADDER SUSPENSION  01/24/2008  . BREAST BIOPSY Left 12/2004   core- neg  . CATARACT EXTRACTION Left 06/14/2013  . CATARACT EXTRACTION Right 06/28/2013  . CHOLECYSTECTOMY  08/2008  . EYE SURGERY Right 1958  . FOOT SURGERY Right 09/07/14  . HEMORROIDECTOMY  11/1977  . HERNIA REPAIR  01/24/2008  . KNEE SURGERY Left 12/1997  . LARYNGOSCOPY  11/2005  . TYMPANOSTOMY TUBE PLACEMENT  1977  . VAGINAL HYSTERECTOMY  04/1986   Patient Active Problem List   Diagnosis Date Noted  . Lumbosacral radiculopathy  06/05/2016      Prior to Admission medications   Medication Sig Start Date End Date Taking? Authorizing Provider  albuterol (PROVENTIL HFA;VENTOLIN HFA) 108 (90 Base) MCG/ACT inhaler Inhale 2 puffs into the lungs every 6 (six) hours as needed for wheezing or shortness of breath.   Yes [provider]  Alum & Mag Hydroxide-Simeth (ANTACID ADVANCED PO) Take 1 tablet by mouth.   Yes [provider]  aspirin 81 MG tablet Take 81 mg by mouth daily.   Yes [provider]  CALCIUM-VITAMIN D PO Take 600 mg by mouth 2 (two) times daily.   Yes [provider]  Cyanocobalamin (VITAMIN B-12 PO) Take 1 tablet by mouth daily. At lunch   Yes [provider]  Magnesium 250 MG TABS Take 2 tablets by mouth daily. With Lunch    Yes [provider]  meclizine (ANTIVERT) 25 MG tablet Take 25 mg by mouth as needed for dizziness.   Yes [provider]  metoprolol succinate (TOPROL-XL) 50 MG 24 hr tablet Take 50 mg by mouth 2 (two) times daily. Take with or immediately following a meal. Per patient: 1 tab in morning and 1 tab at night   Yes [provider]  naproxen sodium (ALEVE) 220 MG tablet Take 440 mg by mouth 2 (two) times daily with a meal.   Yes [provider]  oxybutynin (DITROPAN) 5 MG tablet Take  5 mg by mouth 2 (two) times daily.   Yes [provider]  vitamin E 200 UNIT capsule Take 200 Units by mouth 4 (four) times a week. Takes Sundays, Mondays, Tuesdays and Wednesdays.   Yes [provider]  HYDROcodone-acetaminophen (NORCO) 5-325 MG tablet Take 1-2 tablets by mouth every 4 (four) hours as needed for moderate pain. Patient not taking: Reported on 08/11/2017 09/25/16   Arlyss Repress, PA-C  pantoprazole (PROTONIX) 40 MG tablet Take 40 mg by mouth daily.    [provider]    Allergies Naproxen; Propoxyphene; and Pseudoephedrine hcl    Social History Social History  Substance Use Topics    . Smoking status: Former Smoker    Quit date: 12/23/1999  . Smokeless tobacco: Never Used  . Alcohol use No    Review of Systems Patient denies headaches, rhinorrhea, blurry vision, numbness, shortness of breath, chest pain, edema, cough, abdominal pain, nausea, vomiting, diarrhea, dysuria, fevers, rashes or hallucinations unless otherwise stated above in HPI. ____________________________________________   PHYSICAL EXAM:  VITAL SIGNS: Vitals:   08/11/17 1730 08/11/17 1800  BP: (!) 179/91 (!) 181/97  Pulse: (!) 58 60  Resp: (!) 21 18  Temp:    SpO2: 99% 97%    Constitutional: Alert and oriented. Well appearing and in no acute distress. Eyes: Conjunctivae are normal.  Head: Atraumatic. Nose: No congestion/rhinnorhea. Mouth/Throat: Mucous membranes are moist.   Neck: No stridor. Painless ROM.  Cardiovascular: Normal rate, regular rhythm. Grossly normal heart sounds.  Good peripheral circulation.  + mid sternal ttp Respiratory: Normal respiratory effort.  No retractions. Lungs CTAB. Gastrointestinal: Soft and nontender. No distention. No abdominal bruits. No CVA tenderness. Musculoskeletal: No lower extremity tenderness nor edema.  No joint effusions. Neurologic:  CN- intact.  No facial droop, Normal FNF.  Normal heel to shin.  Sensation intact bilaterally. Normal speech and language. No gross focal neurologic deficits are appreciated. No gait instability. Skin:  Skin is warm, dry and intact. No rash noted. Psychiatric: Mood and affect are normal. Speech and behavior are normal.  ____________________________________________   LABS (all labs ordered are listed, but only abnormal results are displayed)  Results for orders placed or performed during the hospital encounter of 08/11/17 (from the past 24 hour(s))  Basic metabolic panel     Status: Abnormal   Collection Time: 08/11/17  2:37 PM  Result Value Ref Range   Sodium 135 135 - 145 mmol/L   Potassium 3.4 (L) 3.5 - 5.1  mmol/L   Chloride 105 101 - 111 mmol/L   CO2 23 22 - 32 mmol/L   Glucose, Bld 154 (H) 65 - 99 mg/dL   BUN 21 (H) 6 - 20 mg/dL   Creatinine, Ser 0.77 0.44 - 1.00 mg/dL   Calcium 8.6 (L) 8.9 - 10.3 mg/dL   GFR calc non Af Amer >60 >60 mL/min   GFR calc Af Amer >60 >60 mL/min   Anion gap 7 5 - 15  CBC     Status: Abnormal   Collection Time: 08/11/17  2:37 PM  Result Value Ref Range   WBC 9.2 3.6 - 11.0 K/uL   RBC 4.51 3.80 - 5.20 MIL/uL   Hemoglobin 12.9 12.0 - 16.0 g/dL   HCT 37.3 35.0 - 47.0 %   MCV 82.7 80.0 - 100.0 fL   MCH 28.7 26.0 - 34.0 pg   MCHC 34.7 32.0 - 36.0 g/dL   RDW 15.1 (H) 11.5 - 14.5 %   Platelets 354  150 - 440 K/uL  Troponin I     Status: None   Collection Time: 08/11/17  2:37 PM  Result Value Ref Range   Troponin I <0.03 <0.03 ng/mL  Troponin I     Status: None   Collection Time: 08/11/17  5:29 PM  Result Value Ref Range   Troponin I <0.03 <0.03 ng/mL   ____________________________________________  EKG My review and personal interpretation at Time: 14:19   Indication: chest pain  Rate: 70  Rhythm: sinus Axis: left Other: no stemi or depressions, normal intervals ____________________________________________  RADIOLOGY  I personally reviewed all radiographic images ordered to evaluate for the above acute complaints and reviewed radiology reports and findings.  These findings were personally discussed with the patient.  Please see medical record for radiology report.  ____________________________________________   PROCEDURES  Procedure(s) performed:  Procedures    Critical Care performed: no ____________________________________________   INITIAL IMPRESSION / ASSESSMENT AND PLAN / ED COURSE  Pertinent labs & imaging results that were available during my care of the patient were reviewed by me and considered in my medical decision making (see chart for details).  DDX: ACS, pericarditis, tia, vertigo, pe, dissection, pna, bronchitis,  costochondritis   Beata Amaal Dimartino is a 71 y.o. who presents to the ED with Chest discomfort dizziness and lightheadedness as described above. Patient otherwise well appearing in no acute distress. Mildly elevated blood pressure but not critically so. She is no evidence of congestive heart failure. Chest x-ray shows no evidence of pneumonia or CHF.  Is not clinically consistent with dissection she has good peripheral pulses and no pain repair tearing through to her back.  Symptoms seem less consistent with ACS. Does have some reproducible anterior chest wall pain and patient is also endorsing quite a bit of stress at home which may be component of her. Discomfort. Her abdominal exam is soft and benign therefore do not believe that is referred from her abdomen. Heart score of 3 (00210) versus 4 based on subjectivity there for will repeat trop to further risk stratify.  CT imaging of head will be ordered to evaluate her complaint of dizziness.  ----------------------------------------- 6:35 PM on 08/11/2017 ----------------------------------------- Patient reassessed. Repeat troponin is negative therefore given her heart score of 3. Do feel that she be appropriate for further workup as an outpatient. Her CT imaging shows no evidence of acute abnormality. Repeat neuro exam is nonfocal. Patient is in no pain at this time.  I have offered admission to hospital for further workup the patient is declining this requesting workup as an outpatient. I do believe this is an appropriate option.  Have discussed with the patient and available family all diagnostics and treatments performed thus far and all questions were answered to the best of my ability. The patient demonstrates understanding and agreement with plan.       ____________________________________________   FINAL CLINICAL IMPRESSION(S) / ED DIAGNOSES  Final diagnoses:  Chest pain, unspecified type  Dizziness      NEW MEDICATIONS STARTED  DURING THIS VISIT:  New Prescriptions   No medications on file     Note:  This document was prepared using Dragon voice recognition software and may include unintentional dictation errors.    Merlyn Lot, MD 08/11/17 [phone]

## 2017-08-11 NOTE — ED Notes (Signed)
Pt states that she has been feeling "weird", nauseous, and tightness in chest. Pt has Hx of vertigo. Started yesterday. Family at bedside.

## 2017-08-11 NOTE — Discharge Instructions (Signed)
Follow up with Cardiology clinic.  Call their office in the AM to arrange appointment.  Return immediately should you develop any worsening pain, shortness of breath, weakness or for any other concerns.

## 2017-10-26 DIAGNOSIS — J4540 Moderate persistent asthma, uncomplicated: Secondary | ICD-10-CM | POA: Insufficient documentation

## 2017-10-26 DIAGNOSIS — R7989 Other specified abnormal findings of blood chemistry: Secondary | ICD-10-CM | POA: Insufficient documentation

## 2017-11-03 ENCOUNTER — Other Ambulatory Visit: Payer: Self-pay | Admitting: Internal Medicine

## 2017-11-03 DIAGNOSIS — Z1231 Encounter for screening mammogram for malignant neoplasm of breast: Secondary | ICD-10-CM

## 2017-12-11 ENCOUNTER — Ambulatory Visit
Admission: RE | Admit: 2017-12-11 | Discharge: 2017-12-11 | Disposition: A | Discharge status: Home / Self Care | Payer: Medicare Other | Source: Ambulatory Visit | Attending: Internal Medicine | Admitting: Internal Medicine

## 2017-12-11 DIAGNOSIS — Z1231 Encounter for screening mammogram for malignant neoplasm of breast: Secondary | ICD-10-CM | POA: Diagnosis not present

## 2018-02-28 ENCOUNTER — Emergency Department: Payer: Medicare Other

## 2018-02-28 ENCOUNTER — Emergency Department
Admission: EM | Admit: 2018-02-28 | Discharge: 2018-02-28 | Disposition: A | Discharge status: Home / Self Care | Payer: Medicare Other | Attending: Emergency Medicine | Admitting: Emergency Medicine

## 2018-02-28 ENCOUNTER — Other Ambulatory Visit: Payer: Self-pay

## 2018-02-28 DIAGNOSIS — Y929 Unspecified place or not applicable: Secondary | ICD-10-CM | POA: Diagnosis not present

## 2018-02-28 DIAGNOSIS — T148XXA Other injury of unspecified body region, initial encounter: Secondary | ICD-10-CM

## 2018-02-28 DIAGNOSIS — Y999 Unspecified external cause status: Secondary | ICD-10-CM | POA: Insufficient documentation

## 2018-02-28 DIAGNOSIS — Y9301 Activity, walking, marching and hiking: Secondary | ICD-10-CM | POA: Diagnosis not present

## 2018-02-28 DIAGNOSIS — Z79899 Other long term (current) drug therapy: Secondary | ICD-10-CM | POA: Diagnosis not present

## 2018-02-28 DIAGNOSIS — S29012A Strain of muscle and tendon of back wall of thorax, initial encounter: Secondary | ICD-10-CM | POA: Insufficient documentation

## 2018-02-28 DIAGNOSIS — X503XXA Overexertion from repetitive movements, initial encounter: Secondary | ICD-10-CM | POA: Insufficient documentation

## 2018-02-28 DIAGNOSIS — S299XXA Unspecified injury of thorax, initial encounter: Secondary | ICD-10-CM | POA: Diagnosis present

## 2018-02-28 MED ORDER — BACLOFEN 10 MG PO TABS: 10.0000 mg | ORAL_TABLET | Freq: Every day | ORAL | 1 refills | Status: DC

## 2018-02-28 NOTE — Discharge Instructions (Signed)
Follow-up with your regular doctor if not better in 3-5 days.  Use medication as prescribed.  Apply ice to any areas that hurt.  If you are worsening please return the emergency department or see your regular doctor.

## 2018-02-28 NOTE — ED Triage Notes (Signed)
Pt thinks she might have pulled muscle or cracked rib. C/o since Thursday L upper back pain that wraps around to ribs and c/o L arm pain. States she was carrying 5lb weights on walk Wednesday.   Alert, oriented, ambulatory. No distress noted.

## 2018-02-28 NOTE — ED Provider Notes (Signed)
[facility] Emergency Department Provider Note  ____________________________________________   First MD Initiated Contact with Patient 02/28/18 1323     (approximate)  I have reviewed the triage vital signs and the nursing notes.   HISTORY  Chief Complaint Back Pain    HPI Kimberly Page is a 72 y.o. female who presents emergency department because she is having left mid back pain.  She states she went walking with 5 pound weights and the pain has started since then.  She is unsure of a rash.  She states the pain is worse with breathing and movement.  She denies any chest pain or shortness of breath.  She has had a cough.  She denies any numbness or tingling  Past Medical History:  Diagnosis Date  . Vertigo     Patient Active Problem List   Diagnosis Date Noted  . Lumbosacral radiculopathy 06/05/2016    Past Surgical History:  Procedure Laterality Date  . BLADDER SUSPENSION  01/24/2008  . BREAST BIOPSY Left 12/2004   core- neg  . CATARACT EXTRACTION Left 06/14/2013  . CATARACT EXTRACTION Right 06/28/2013  . CHOLECYSTECTOMY  08/2008  . EYE SURGERY Right 1958  . FOOT SURGERY Right 09/07/14  . HEMORROIDECTOMY  11/1977  . HERNIA REPAIR  01/24/2008  . KNEE SURGERY Left 12/1997  . LARYNGOSCOPY  11/2005  . TYMPANOSTOMY TUBE PLACEMENT  1977  . VAGINAL HYSTERECTOMY  04/1986    Prior to Admission medications   Medication Sig Start Date End Date Taking? Authorizing Provider  albuterol (PROVENTIL HFA;VENTOLIN HFA) 108 (90 Base) MCG/ACT inhaler Inhale 2 puffs into the lungs every 6 (six) hours as needed for wheezing or shortness of breath.    [provider]  Alum & Mag Hydroxide-Simeth (ANTACID ADVANCED PO) Take 1 tablet by mouth.    [provider]  aspirin 81 MG tablet Take 81 mg by mouth daily.    [provider]  baclofen (LIORESAL) 10 MG tablet Take 1 tablet (10 mg total) by mouth daily. 02/28/18 02/28/19  Daemion Mcniel, Linden Dolin, PA-C  CALCIUM-VITAMIN D PO Take 600 mg by mouth 2 (two) times daily.    [provider]  Cyanocobalamin (VITAMIN B-12 PO) Take 1 tablet by mouth daily. At lunch    [provider]  Magnesium 250 MG TABS Take 2 tablets by mouth daily. With Lunch     [provider]  meclizine (ANTIVERT) 25 MG tablet Take 25 mg by mouth as needed for dizziness.    [provider]  metoprolol succinate (TOPROL-XL) 50 MG 24 hr tablet Take 50 mg by mouth 2 (two) times daily. Take with or immediately following a meal. Per patient: 1 tab in morning and 1 tab at night    [provider]  naproxen sodium (ALEVE) 220 MG tablet Take 440 mg by mouth 2 (two) times daily with a meal.    [provider]  oxybutynin (DITROPAN) 5 MG tablet Take 5 mg by mouth 2 (two) times daily.    [provider]  pantoprazole (PROTONIX) 40 MG tablet Take 40 mg by mouth daily.    [provider]  vitamin E 200 UNIT capsule Take 200 Units by mouth 4 (four) times a week. Takes Sundays, Mondays, Tuesdays and Wednesdays.    [provider]    Allergies Naproxen; Propoxyphene; and Pseudoephedrine hcl  Family History  Problem Relation Age of Onset  . Cancer Unknown   . Breast cancer Paternal Aunt   .  Breast cancer Cousin     Social History Social History   Tobacco Use  . Smoking status: Former Smoker    Last attempt to quit: 12/23/1999    Years since quitting: 18.1  . Smokeless tobacco: Never Used  Substance Use Topics  . Alcohol use: No    Alcohol/week: 0.0 oz  . Drug use: No    Review of Systems  Constitutional: No fever/chills Eyes: No visual changes. ENT: No sore throat. Respiratory: Denies cough Genitourinary: Negative for dysuria. Musculoskeletal: Negative for back pain.  Positive for left rib and upper back pain Skin: Negative for rash.    ____________________________________________   PHYSICAL EXAM:  VITAL SIGNS: ED Triage  Vitals  Enc Vitals Group     BP 02/28/18 1206 (!) 148/72     Pulse Rate 02/28/18 1206 60     Resp 02/28/18 1206 18     Temp 02/28/18 1206 98.2 F (36.8 C)     Temp Source 02/28/18 1206 Oral     SpO2 02/28/18 1206 98 %     Weight 02/28/18 1207 180 lb (81.6 kg)     Height 02/28/18 1207 5\' 5"  (1.651 m)     Head Circumference --      Peak Flow --      Pain Score 02/28/18 1207 7     Pain Loc --      Pain Edu? --      Excl. in [place]? --     Constitutional: Alert and oriented. Well appearing and in no acute distress. Eyes: Conjunctivae are normal.  Head: Atraumatic. Nose: No congestion/rhinnorhea. Mouth/Throat: Mucous membranes are moist.   Cardiovascular: Normal rate, regular rhythm.  Heart sounds are normal Respiratory: Normal respiratory effort.  No retractions, lungs are clear to auscultation Abdomen: Is soft, nontender GU: deferred Musculoskeletal: FROM all extremities, warm and well perfused, the thoracic spine to the left ribs are tender.  The shoulder is not tender.  She has a spasm in the left shoulder.  She is neurovascularly intact Neurologic:  Normal speech and language.  Skin:  Skin is warm, dry and intact. No rash noted. Psychiatric: Mood and affect are normal. Speech and behavior are normal.  ____________________________________________   LABS (all labs ordered are listed, but only abnormal results are displayed)  Labs Reviewed - No data to display ____________________________________________   ____________________________________________  RADIOLOGY  Chest x-ray is negative.  There are no fractures noted along the thoracic spine and the ribs appear to be intact  ____________________________________________   PROCEDURES  Procedure(s) performed: No  Procedures    ____________________________________________   INITIAL IMPRESSION / ASSESSMENT AND PLAN / ED COURSE  Pertinent labs & imaging results that were available during my care of the patient were  reviewed by me and considered in my medical decision making (see chart for details).  Patient is a 72 year old female complaining of left side pain and shoulder pain  On physical exam the areas are tender to palpation.  Chest x-ray is negative for any fracture or acute abnormality  X-ray results were discussed with the patient.  She was given a prescription for a muscle relaxer.  She is to follow-up with her regular doctor if not better in 5-7 days.  She is to apply wet heat and ice to the area.  She states she understands and will comply with instructions.  She was discharged in stable condition     As part of my medical decision making, I reviewed the following data within the  electronic MEDICAL RECORD NUMBER Nursing notes reviewed and incorporated, Radiograph reviewed chest x-rays negative, Notes from prior ED visits and [place] Controlled Substance Database  ____________________________________________   FINAL CLINICAL IMPRESSION(S) / ED DIAGNOSES  Final diagnoses:  Muscle strain      NEW MEDICATIONS STARTED DURING THIS VISIT:  Discharge Medication List as of 02/28/2018  2:37 PM    START taking these medications   Details  baclofen (LIORESAL) 10 MG tablet Take 1 tablet (10 mg total) by mouth daily., Starting Sun 02/28/2018, Until Mon 02/28/2019, Print         Note:  This document was prepared using Dragon voice recognition software and may include unintentional dictation errors.    Versie Starks, PA-C 02/28/18 1627    Lisa Roca, MD 03/03/18 [phone]

## 2018-02-28 NOTE — ED Notes (Signed)
See triage note   States she developed some pain to left mid back and arm on weds after using a 5 pd wt with exercising   Thinks she may have pulled a muscle  States today the pain is worse in breathing and movement

## 2018-10-29 ENCOUNTER — Other Ambulatory Visit: Payer: Self-pay | Admitting: Internal Medicine

## 2018-10-29 DIAGNOSIS — Z1231 Encounter for screening mammogram for malignant neoplasm of breast: Secondary | ICD-10-CM

## 2018-12-13 ENCOUNTER — Ambulatory Visit
Admission: RE | Admit: 2018-12-13 | Discharge: 2018-12-13 | Disposition: A | Discharge status: Home / Self Care | Payer: Medicare Other | Source: Ambulatory Visit | Attending: Internal Medicine | Admitting: Internal Medicine

## 2018-12-13 DIAGNOSIS — Z1231 Encounter for screening mammogram for malignant neoplasm of breast: Secondary | ICD-10-CM | POA: Diagnosis not present

## 2019-01-10 ENCOUNTER — Encounter: Payer: Self-pay | Admitting: Internal Medicine

## 2019-01-10 ENCOUNTER — Telehealth: Payer: Self-pay

## 2019-01-10 ENCOUNTER — Ambulatory Visit: Payer: Medicare Other | Admitting: Internal Medicine

## 2019-01-10 VITALS — BP 120/70 | HR 57 | Ht 63.0 in | Wt 182.4 lb

## 2019-01-10 DIAGNOSIS — R0602 Shortness of breath: Secondary | ICD-10-CM | POA: Diagnosis not present

## 2019-01-10 DIAGNOSIS — R918 Other nonspecific abnormal finding of lung field: Secondary | ICD-10-CM

## 2019-01-10 DIAGNOSIS — R911 Solitary pulmonary nodule: Secondary | ICD-10-CM

## 2019-01-10 NOTE — Patient Instructions (Addendum)
Please obtain a copy of your CT on a CD for [place] to review.  Will obtain lung function test in 3 months.  Continue advair once daily, rinse mouth after use. Use albuterol as needed.  Will repeat CT chest in 3 months and see you after that.

## 2019-01-10 NOTE — Telephone Encounter (Signed)
During visit patient states her referring MD wanted her to see Dr. Alva Garnet and this is who she wants to see moving forward. Dr. Ashby Dawes has been made aware.   Dr. Alva Garnet please advise if ok with this we will schedule patients next follow up visit with you. Thanks.

## 2019-01-10 NOTE — Progress Notes (Addendum)
[facility] Consultation      Assessment and Plan:  Lung nodule. - CT chest performed at [facility] showed a right lower lobe 1.2 cm nodule per report, 64-month follow-up was recommended. - Patient asked to obtain copy of CT on CD for [place] to review, and to compare with future images. - No alarming symptoms at this time, will repeat CT chest without contrast in 3 months and have her follow-up at that time.  COPD. - Suspected based on history and symptoms of dyspnea on exertion. - Continue Advair once daily, continue albuterol as needed. - We will obtain pulmonary function test before next follow-up.  Orders Placed This Encounter  Procedures  . CT CHEST WO CONTRAST  . Pulmonary Function Test [facility] Only   Return in about 3 months (around 04/11/2019).   Addendum:  Pt had thought that she was going to see Dr. Alva Garnet today and is upset. Will arrange further followup with D.S.   Date: 01/10/2019  MRN# [id number] Alejandro Adcox [place] 26-Jul-1946  Referring Physician: Dr. Cheri Guppy Ludene Stokke is a 73 y.o. old female seen in consultation for chief complaint of:    Chief Complaint  Patient presents with  . Consult    Abnormal Chest CT12/12/19. States she has intermittent SOB. She states it is slowly getting worse. Denies chest pain or discomfort. She reports she has intermittent dry cough. States advair makes her hoarse.     HPI:   She recently underwent a stress test which showed abnormalities, then sent for cardiac which showed nodules and then referred here.  She has smoked until 18 yrs ago, smoked for about 50 years, about a 1.5 ppd. 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 She has never been tested for OSA.  She has dyspnea with walking a flight of stairs, she can walk a [employer] of other places ok as long as it is level and she takes her time.  No weight loss, no hemoptysis or cough.  She  has always lived in this area. She has travelled to [place].   She has advair but it gave her hoarseness, so she went to once daily. She does not think that it does anything for her. She takes ventolin after walking an incline which seems to help.   **Review of outside records from 12/16/2018, Dr. Ubaldo Glassing.  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 **Chest x-ray 02/28/2018>> images personally reviewed, noted no changes of chronic bronchitis, mild hilar fullness per report.   PMHX:   Past Medical History:  Diagnosis Date  . Vertigo    Surgical Hx:  Past Surgical History:  Procedure Laterality Date  . BLADDER SUSPENSION  01/24/2008  . BREAST BIOPSY Left 12/2004   core- neg  . CATARACT EXTRACTION Left 06/14/2013  . CATARACT EXTRACTION Right 06/28/2013  . CHOLECYSTECTOMY  08/2008  . EYE SURGERY Right 1958  . FOOT SURGERY Right 09/07/14  . HEMORROIDECTOMY  11/1977  . HERNIA REPAIR  01/24/2008  . KNEE SURGERY Left 12/1997  . LARYNGOSCOPY  11/2005  . TYMPANOSTOMY TUBE PLACEMENT  1977  . VAGINAL HYSTERECTOMY  04/1986   Family Hx:  Family History  Problem Relation Age of Onset  .  Cancer Other   . Breast cancer Paternal Aunt   . Breast cancer Cousin    Social Hx:   Social History   Tobacco Use  . Smoking status: Former Smoker    Last attempt to quit: 12/23/1999    Years since quitting: 19.0  . Smokeless tobacco: Never Used  Substance Use Topics  . Alcohol use: No    Alcohol/week: 0.0 standard drinks  . Drug use: No   Medication:    Current Outpatient Medications:  .  albuterol (PROVENTIL HFA;VENTOLIN HFA) 108 (90 Base) MCG/ACT inhaler, Inhale 2 puffs into the lungs every 6 (six) hours as needed for wheezing or shortness of breath., Disp: , Rfl:  .  aspirin  81 MG tablet, Take 81 mg by mouth daily., Disp: , Rfl:  .  CALCIUM-VITAMIN D PO, Take 600 mg by mouth 2 (two) times daily., Disp: , Rfl:  .  Cyanocobalamin (VITAMIN B-12 PO), Take 1 tablet by mouth daily. At lunch, Disp: , Rfl:  .  famotidine (PEPCID) 20 MG tablet, Take 20 mg by mouth daily., Disp: , Rfl:  .  Magnesium 250 MG TABS, Take 2 tablets by mouth daily. With Lunch , Disp: , Rfl:  .  meclizine (ANTIVERT) 25 MG tablet, Take 25 mg by mouth as needed for dizziness., Disp: , Rfl:  .  metoprolol succinate (TOPROL-XL) 50 MG 24 hr tablet, Take 50 mg by mouth 2 (two) times daily. Take with or immediately following a meal. Per patient: 1 tab in morning and 1 tab at night, Disp: , Rfl:  .  naproxen sodium (ALEVE) 220 MG tablet, Take 440 mg by mouth 2 (two) times daily with a meal., Disp: , Rfl:  .  omeprazole (PRILOSEC) 40 MG capsule, Take 40 mg by mouth daily., Disp: , Rfl:  .  oxybutynin (DITROPAN) 5 MG tablet, Take 5 mg by mouth 2 (two) times daily., Disp: , Rfl:  .  vitamin E 200 UNIT capsule, Take 200 Units by mouth 4 (four) times a week. Takes Sundays, Mondays, Tuesdays and Wednesdays., Disp: , Rfl:    Allergies:  Naproxen; Propoxyphene; and Pseudoephedrine hcl  Review of Systems: Gen:  Denies  fever, sweats, chills HEENT: Denies blurred vision, double vision. bleeds, sore throat Cvc:  No dizziness, chest pain. Resp:   Denies cough or sputum production, shortness of breath Gi: Denies swallowing difficulty, stomach pain. Gu:  Denies bladder incontinence, burning urine Ext:   No Joint pain, stiffness. Skin: No skin rash,  hives  Endoc:  No polyuria, polydipsia. Psych: No depression, insomnia. Other:  All other systems were reviewed with the patient and were negative other that what is mentioned in the HPI.   Physical Examination:   VS: BP 120/70   Pulse (!) 57   Ht 5\' 3"  (1.6 m)   Wt 182 lb 6.4 oz (82.7 kg)   SpO2 96%   BMI 32.31 kg/m   General Appearance: No distress    Neuro:without focal findings,  speech normal,  HEENT: PERRLA, EOM intact.   Pulmonary: normal breath sounds, No wheezing.  CardiovascularNormal S1,S2.  No m/r/g.   Abdomen: Benign, Soft, non-tender. Renal:  No costovertebral tenderness  GU:  No performed at this time. Endoc: No evident thyromegaly, no signs of acromegaly. Skin:   warm, no rashes, no ecchymosis  Extremities: normal, no cyanosis, clubbing.  Other findings:    LABORATORY PANEL:   CBC No results for input(s): WBC, HGB, HCT, PLT in the last 168 hours. ------------------------------------------------------------------------------------------------------------------  Chemistries  No results for input(s): NA, K, CL, CO2, GLUCOSE, BUN, CREATININE, CALCIUM, MG, AST, ALT, ALKPHOS, BILITOT in the last 168 hours.  Invalid input(s): GFRCGP ------------------------------------------------------------------------------------------------------------------  Cardiac Enzymes No results for input(s): TROPONINI in the last 168 hours. ------------------------------------------------------------  RADIOLOGY:  No results found.     Thank  you for the consultation and for allowing [facility] to assist in the care of your patient. Our recommendations are noted above.  Please contact us if we can be of further service.   Marda Stalker, M.D., F.C.C.P.  Board Certified in Internal Medicine, Pulmonary Medicine, [place], and Sleep Medicine.  [facility] Office Number: [phone]   01/10/2019

## 2019-01-11 NOTE — Telephone Encounter (Signed)
Ok

## 2019-01-31 ENCOUNTER — Telehealth: Payer: Self-pay | Admitting: Internal Medicine

## 2019-01-31 MED ORDER — ALBUTEROL SULFATE HFA 108 (90 BASE) MCG/ACT IN AERS: 2.0000 | INHALATION_SPRAY | Freq: Four times a day (QID) | RESPIRATORY_TRACT | 2 refills | Status: AC | PRN

## 2019-01-31 MED ORDER — WIXELA INHUB 250-50 MCG/DOSE IN AEPB: 1.0000 | INHALATION_SPRAY | Freq: Two times a day (BID) | RESPIRATORY_TRACT | 2 refills | Status: DC

## 2019-01-31 NOTE — Telephone Encounter (Signed)
Refills sent, patient aware. 

## 2019-01-31 NOTE — Telephone Encounter (Signed)
Pt called in to schedule return appointment but needs a refill on Advair Discus and Albuterol Inhaler to [facility] on [address] ([place]).  [place]

## 2019-04-05 ENCOUNTER — Ambulatory Visit: Payer: Medicare Other

## 2019-04-11 ENCOUNTER — Ambulatory Visit: Payer: Medicare Other

## 2019-04-13 ENCOUNTER — Ambulatory Visit: Payer: Medicare Other | Admitting: Pulmonary Disease

## 2019-04-27 DIAGNOSIS — R918 Other nonspecific abnormal finding of lung field: Secondary | ICD-10-CM | POA: Insufficient documentation

## 2019-05-03 ENCOUNTER — Ambulatory Visit: Payer: Medicare Other

## 2019-05-04 ENCOUNTER — Ambulatory Visit: Payer: Medicare Other

## 2019-05-04 ENCOUNTER — Ambulatory Visit
Admission: RE | Admit: 2019-05-04 | Discharge: 2019-05-04 | Disposition: A | Discharge status: Home / Self Care | Payer: Medicare Other | Source: Ambulatory Visit | Attending: Internal Medicine | Admitting: Internal Medicine

## 2019-05-04 ENCOUNTER — Other Ambulatory Visit: Payer: Self-pay

## 2019-05-04 DIAGNOSIS — R918 Other nonspecific abnormal finding of lung field: Secondary | ICD-10-CM | POA: Diagnosis not present

## 2019-05-09 ENCOUNTER — Ambulatory Visit: Payer: Medicare Other | Admitting: Pulmonary Disease

## 2019-05-10 ENCOUNTER — Other Ambulatory Visit: Payer: Self-pay | Admitting: Pulmonary Disease

## 2019-05-10 DIAGNOSIS — R918 Other nonspecific abnormal finding of lung field: Secondary | ICD-10-CM

## 2019-05-10 DIAGNOSIS — R0602 Shortness of breath: Secondary | ICD-10-CM

## 2019-05-19 ENCOUNTER — Other Ambulatory Visit: Payer: Self-pay

## 2019-05-19 ENCOUNTER — Ambulatory Visit (INDEPENDENT_AMBULATORY_CARE_PROVIDER_SITE_OTHER): Payer: Medicare Other | Admitting: Surgery

## 2019-05-19 ENCOUNTER — Encounter: Payer: Self-pay | Admitting: Surgery

## 2019-05-19 VITALS — BP 156/87 | HR 66 | Temp 97.7°F | Resp 18 | Ht 65.0 in | Wt 185.8 lb

## 2019-05-19 DIAGNOSIS — K449 Diaphragmatic hernia without obstruction or gangrene: Secondary | ICD-10-CM | POA: Diagnosis not present

## 2019-05-19 NOTE — Patient Instructions (Addendum)
Barium Swallow test is scheduled May 30, 2019 @ 10 am. Nothing by mouth after midnight.     We will send the referral to [facility]  Gastroenterologist to have an Upper Endoscopy done. Someone from their office will call to schedule an appointment.   Please see your follow up listed below.     Hiatal Hernia  A hiatal hernia occurs when part of the stomach slides above the muscle that separates the abdomen from the chest (diaphragm). A person can be born with a hiatal hernia (congenital), or it may develop over time. In almost all cases of hiatal hernia, only the top part of the stomach pushes through the diaphragm. Many people have a hiatal hernia with no symptoms. The larger the hernia, the more likely it is that you will have symptoms. In some cases, a hiatal hernia allows stomach acid to flow back into the tube that carries food from your mouth to your stomach (esophagus). This may cause heartburn symptoms. Severe heartburn symptoms may mean that you have developed a condition called gastroesophageal reflux disease (GERD). What are the causes? This condition is caused by a weakness in the opening (hiatus) where the esophagus passes through the diaphragm to attach to the upper part of the stomach. A person may be born with a weakness in the hiatus, or a weakness can develop over time. What increases the risk? This condition is more likely to develop in:  Older people. Age is a major risk factor for a hiatal hernia, especially if you are over the age of 79.  Pregnant women.  People who are overweight.  People who have frequent constipation. What are the signs or symptoms? Symptoms of this condition usually develop in the form of GERD symptoms. Symptoms include:  Heartburn.  Belching.  Indigestion.  Trouble swallowing.  Coughing or wheezing.  Sore throat.  Hoarseness.  Chest pain.  Nausea and vomiting. How is this diagnosed? This condition may be diagnosed during  testing for GERD. Tests that may be done include:  X-rays of your stomach or chest.  An upper gastrointestinal (GI) series. This is an X-ray exam of your GI tract that is taken after you swallow a chalky liquid that shows up clearly on the X-ray.  Endoscopy. This is a procedure to look into your stomach using a thin, flexible tube that has a tiny camera and light on the end of it. How is this treated? This condition may be treated by:  Dietary and lifestyle changes to help reduce GERD symptoms.  Medicines. These may include: ? Over-the-counter antacids. ? Medicines that make your stomach empty more quickly. ? Medicines that block the production of stomach acid (H2 blockers). ? Stronger medicines to reduce stomach acid (proton pump inhibitors).  Surgery to repair the hernia, if other treatments are not helping. If you have no symptoms, you may not need treatment. Follow these instructions at home: Lifestyle and activity  Do not use any products that contain nicotine or tobacco, such as cigarettes and e-cigarettes. If you need help quitting, ask your health care provider.  Try to achieve and maintain a healthy body weight.  Avoid putting pressure on your abdomen. Anything that puts pressure on your abdomen increases the amount of acid that may be pushed up into your esophagus. ? Avoid bending over, especially after eating. ? Raise the head of your bed by putting blocks under the legs. This keeps your head and esophagus higher than your stomach. ? Do not wear tight  clothing around your chest or stomach. ? Try not to strain when having a bowel movement, when urinating, or when lifting heavy objects. Eating and drinking  Avoid foods that can worsen GERD symptoms. These may include: ? Fatty foods, like fried foods. ? Citrus fruits, like oranges or lemon. ? Other foods and drinks that contain acid, like orange juice or tomatoes. ? Spicy food. ? Chocolate.  Eat frequent small meals  instead of three large meals a day. This helps prevent your stomach from getting too full. ? Eat slowly. ? Do not lie down right after eating. ? Do not eat 1-2 hours before bed.  Do not drink beverages with caffeine. These include cola, coffee, cocoa, and tea.  Do not drink alcohol. General instructions  Take over-the-counter and prescription medicines only as told by your health care provider.  Keep all follow-up visits as told by your health care provider. This is important. Contact a health care provider if:  Your symptoms are not controlled with medicines or lifestyle changes.  You are having trouble swallowing.  You have coughing or wheezing that will not go away. Get help right away if:  Your pain is getting worse.  Your pain spreads to your arms, neck, jaw, teeth, or back.  You have shortness of breath.  You sweat for no reason.  You feel sick to your stomach (nauseous) or you vomit.  You vomit blood.  You have bright red blood in your stools.  You have black, tarry stools. This information is not intended to replace advice given to you by your health care provider. Make sure you discuss any questions you have with your health care provider. Document Released: 02/28/2004 Document Revised: 07/13/2017 Document Reviewed: 07/13/2017 Elsevier Interactive Patient Education  2019 [employer].

## 2019-05-19 NOTE — Progress Notes (Signed)
Patient ID: Terrilee Files, female   DOB: 1946-04-28, 73 y.o.   MRN: [id number]  HPI Kimiye Jozi Malachi is a 73 y.o. female seen in consultation at the request of Dr. Lanney Gins for Hiatal Hernia.  Reports she has been having reflux for several years and that usually is exacerbated when she lays flat.  She experienced some occasional chest discomfort.  She does report significant eructation and sometimes the feeling of food getting stuck in her chest. She does have a significant history of COPD and uses inhalers every day.  She does not use any oxygen. She sees Dr Lanney Gins for her COPD and denies any recent exacerbation or infection.  She did have a history of smoking and quit close to 20 years ago.  She is able to walk for about 30 minutes without shortness of breath or chest pain at slow pace . A CT scan of the chest performed, I have personally reviewed showing a large hiatal hernia with bronchiectasis and chronic atypical infection.  She Is on Prilosec and H2 blockers at this time. RePorts that the Prilosec really helps. He has had cholecystectomy by Dr. Tamala Julian several years ago as well as a bladder sling.  She also reports that she had a colonoscopy several years ago but does not remember the date and she usually sees Dr. Vira Agar   HPI  Past Medical History:  Diagnosis Date  . Vertigo     Past Surgical History:  Procedure Laterality Date  . BLADDER SUSPENSION  01/24/2008  . BREAST BIOPSY Left 12/2004   core- neg  . CATARACT EXTRACTION Left 06/14/2013  . CATARACT EXTRACTION Right 06/28/2013  . CHOLECYSTECTOMY  08/2008  . EYE SURGERY Right 1958  . FOOT SURGERY Right 09/07/14  . HEMORROIDECTOMY  11/1977  . HERNIA REPAIR  01/24/2008  . KNEE SURGERY Left 12/1997  . LARYNGOSCOPY  11/2005  . TYMPANOSTOMY TUBE PLACEMENT  1977  . VAGINAL HYSTERECTOMY  04/1986    Family History  Problem Relation Age of Onset  . Hypertension Mother   . Stroke Mother   . Leukemia Father   . Cancer Other    . Breast cancer Paternal Aunt   . Breast cancer Cousin     Social History Social History   Tobacco Use  . Smoking status: Former Smoker    Last attempt to quit: 12/23/1999    Years since quitting: 19.4  . Smokeless tobacco: Never Used  Substance Use Topics  . Alcohol use: No    Alcohol/week: 0.0 standard drinks  . Drug use: No    Allergies  Allergen Reactions  . Naproxen Other (See Comments)    passed out  . Propoxyphene Other (See Comments)  . Pseudoephedrine Hcl Palpitations  . Pseudoephedrine Hcl Palpitations    Current Outpatient Medications  Medication Sig Dispense Refill  . albuterol (PROVENTIL HFA;VENTOLIN HFA) 108 (90 Base) MCG/ACT inhaler Inhale 2 puffs into the lungs every 6 (six) hours as needed for wheezing or shortness of breath. 18 g 2  . aspirin 81 MG tablet Take 81 mg by mouth daily.    Marland Kitchen CALCIUM-VITAMIN D PO Take 600 mg by mouth 2 (two) times daily.    . Cyanocobalamin (VITAMIN B-12 PO) Take 1 tablet by mouth daily. At lunch    . famotidine (PEPCID) 20 MG tablet Take 20 mg by mouth daily.    . Magnesium 250 MG TABS Take 2 tablets by mouth daily. With Lunch     . meclizine (ANTIVERT) 25 MG tablet  Take 25 mg by mouth as needed for dizziness.    . metoprolol succinate (TOPROL-XL) 50 MG 24 hr tablet Take 50 mg by mouth 2 (two) times daily. Take with or immediately following a meal. Per patient: 1 tab in morning and 1 tab at night    . naproxen sodium (ALEVE) 220 MG tablet Take 440 mg by mouth 2 (two) times daily with a meal.    . omeprazole (PRILOSEC) 40 MG capsule Take 40 mg by mouth daily.    Marland Kitchen oxybutynin (DITROPAN) 5 MG tablet Take 5 mg by mouth 2 (two) times daily.    . vitamin E 200 UNIT capsule Take 200 Units by mouth 4 (four) times a week. Takes Sundays, Mondays, Tuesdays and Wednesdays.    Grant Ruts INHUB 250-50 MCG/DOSE AEPB Inhale 1 puff into the lungs 2 (two) times daily. 60 each 2   No current facility-administered medications for this visit.       Review of Systems Full ROS  was asked and was negative except for the information on the HPI  Physical Exam Blood pressure (!) 156/87, pulse 66, temperature 97.7 F (36.5 C), temperature source Temporal, resp. rate 18, height 5\' 5"  (1.651 m), weight 185 lb 12.8 oz (84.3 kg), SpO2 96 %. CONSTITUTIONAL: NAD EYES: Pupils are equal, round, and reactive to light, Sclera are non-icteric. EARS, NOSE, MOUTH AND THROAT: The oropharynx is clear. The oral mucosa is pink and moist. Hearing is intact to voice. LYMPH NODES:  Lymph nodes in the neck are normal. RESPIRATORY:  Lungs are clear. There is normal respiratory effort, with equal breath sounds bilaterally, and without pathologic use of accessory muscles. CARDIOVASCULAR: Heart is regular without murmurs, gallops, or rubs. GI: The abdomen is  soft, nontender, and nondistended. There are no palpable masses. There is no hepatosplenomegaly. There are normal bowel sounds in all quadrants. GU: Rectal deferred.   MUSCULOSKELETAL: Normal muscle strength and tone. No cyanosis or edema.   SKIN: Turgor is good and there are no pathologic skin lesions or ulcers. NEUROLOGIC: Motor and sensation is grossly normal. Cranial nerves are grossly intact. PSYCH:  Oriented to person, place and time. Affect is normal.  Data Reviewed  I have personally reviewed the patient's imaging, laboratory findings and medical records.    Assessment/Plan 73 year old female with a moderate to large hiatal hernia that is symptomatic and likely the cause of chronic microaspiration and exacerbating her pulmonary condition. Discussion with patient regarding the rational to repair these hiatal hernias specifically for this chronic pulmonary conditions.  She still has reflux symptoms and I do think it is a very reasonable option.  Before we proceed with any surgical interventions I will like to evaluate both the anatomy and the function of the esophagus.  We will request Dr. Vira Agar  to perform an upper endoscopy I will also order a barium swallow.  I will see her back after those studies are completed.  At this time there is no need for emergent surgical intervention. Extensive counseling was given to the patient and all the questions were answered Copy of this report was sent to the referring provider  Caroleen Hamman, MD FACS General Surgeon 05/19/2019, 9:47 AM

## 2019-05-23 DIAGNOSIS — R918 Other nonspecific abnormal finding of lung field: Secondary | ICD-10-CM

## 2019-05-23 HISTORY — DX: Other nonspecific abnormal finding of lung field: R91.8

## 2019-05-30 ENCOUNTER — Other Ambulatory Visit: Payer: Self-pay | Admitting: Surgery

## 2019-05-30 ENCOUNTER — Other Ambulatory Visit: Payer: Self-pay

## 2019-05-30 ENCOUNTER — Ambulatory Visit
Admission: RE | Admit: 2019-05-30 | Discharge: 2019-05-30 | Disposition: A | Discharge status: Home / Self Care | Payer: Medicare Other | Source: Ambulatory Visit | Attending: Surgery | Admitting: Surgery

## 2019-05-30 ENCOUNTER — Telehealth: Payer: Self-pay | Admitting: *Deleted

## 2019-05-30 DIAGNOSIS — K449 Diaphragmatic hernia without obstruction or gangrene: Secondary | ICD-10-CM

## 2019-05-30 NOTE — Telephone Encounter (Signed)
Notified patient as instructed, Patient to follow up on 06/13/19 with Dr. Dahlia Byes. patient pleased. Discussed follow-up appointments, patient agrees

## 2019-05-31 ENCOUNTER — Other Ambulatory Visit
Admission: RE | Admit: 2019-05-31 | Discharge: 2019-05-31 | Disposition: A | Discharge status: Home / Self Care | Payer: Medicare Other | Source: Ambulatory Visit | Attending: Gastroenterology | Admitting: Gastroenterology

## 2019-05-31 ENCOUNTER — Ambulatory Visit: Payer: Medicare Other

## 2019-05-31 DIAGNOSIS — Z01812 Encounter for preprocedural laboratory examination: Secondary | ICD-10-CM | POA: Insufficient documentation

## 2019-05-31 DIAGNOSIS — Z1159 Encounter for screening for other viral diseases: Secondary | ICD-10-CM | POA: Diagnosis not present

## 2019-06-01 LAB — NOVEL CORONAVIRUS, NAA (HOSP ORDER, SEND-OUT TO REF LAB; TAT 18-24 HRS): SARS-CoV-2, NAA: NOT DETECTED

## 2019-06-02 ENCOUNTER — Ambulatory Visit: Payer: Medicare Other

## 2019-06-03 ENCOUNTER — Ambulatory Visit: Payer: Medicare Other | Admitting: Family

## 2019-06-03 ENCOUNTER — Encounter
Admission: RE | Disposition: A | Discharge status: Home / Self Care | Payer: Self-pay | Source: Home / Self Care | Attending: Gastroenterology

## 2019-06-03 ENCOUNTER — Ambulatory Visit
Admission: RE | Admit: 2019-06-03 | Discharge: 2019-06-03 | Disposition: A | Discharge status: Home / Self Care | Payer: Medicare Other | Attending: Gastroenterology | Admitting: Gastroenterology

## 2019-06-03 DIAGNOSIS — K210 Gastro-esophageal reflux disease with esophagitis: Secondary | ICD-10-CM | POA: Diagnosis not present

## 2019-06-03 DIAGNOSIS — I10 Essential (primary) hypertension: Secondary | ICD-10-CM | POA: Insufficient documentation

## 2019-06-03 DIAGNOSIS — Z79899 Other long term (current) drug therapy: Secondary | ICD-10-CM | POA: Diagnosis not present

## 2019-06-03 DIAGNOSIS — K319 Disease of stomach and duodenum, unspecified: Secondary | ICD-10-CM | POA: Insufficient documentation

## 2019-06-03 DIAGNOSIS — K228 Other specified diseases of esophagus: Secondary | ICD-10-CM | POA: Diagnosis not present

## 2019-06-03 DIAGNOSIS — Z87891 Personal history of nicotine dependence: Secondary | ICD-10-CM | POA: Insufficient documentation

## 2019-06-03 DIAGNOSIS — K219 Gastro-esophageal reflux disease without esophagitis: Secondary | ICD-10-CM | POA: Diagnosis present

## 2019-06-03 DIAGNOSIS — R32 Unspecified urinary incontinence: Secondary | ICD-10-CM | POA: Diagnosis not present

## 2019-06-03 DIAGNOSIS — I4891 Unspecified atrial fibrillation: Secondary | ICD-10-CM | POA: Insufficient documentation

## 2019-06-03 DIAGNOSIS — K2970 Gastritis, unspecified, without bleeding: Secondary | ICD-10-CM | POA: Diagnosis not present

## 2019-06-03 DIAGNOSIS — J449 Chronic obstructive pulmonary disease, unspecified: Secondary | ICD-10-CM | POA: Insufficient documentation

## 2019-06-03 DIAGNOSIS — Z888 Allergy status to other drugs, medicaments and biological substances status: Secondary | ICD-10-CM | POA: Diagnosis not present

## 2019-06-03 DIAGNOSIS — Z886 Allergy status to analgesic agent status: Secondary | ICD-10-CM | POA: Insufficient documentation

## 2019-06-03 DIAGNOSIS — K449 Diaphragmatic hernia without obstruction or gangrene: Secondary | ICD-10-CM | POA: Insufficient documentation

## 2019-06-03 DIAGNOSIS — Z7982 Long term (current) use of aspirin: Secondary | ICD-10-CM | POA: Insufficient documentation

## 2019-06-03 DIAGNOSIS — K259 Gastric ulcer, unspecified as acute or chronic, without hemorrhage or perforation: Secondary | ICD-10-CM | POA: Diagnosis not present

## 2019-06-03 HISTORY — DX: Hemangioma of intra-abdominal structures: D18.03

## 2019-06-03 HISTORY — DX: Carpal tunnel syndrome, unspecified upper limb: G56.00

## 2019-06-03 HISTORY — DX: Unspecified atrial fibrillation: I48.91

## 2019-06-03 HISTORY — PX: ESOPHAGOGASTRODUODENOSCOPY (EGD) WITH PROPOFOL: SHX5813

## 2019-06-03 HISTORY — DX: Chronic obstructive pulmonary disease, unspecified: J44.9

## 2019-06-03 HISTORY — DX: Cardiac arrhythmia, unspecified: I49.9

## 2019-06-03 HISTORY — DX: Unspecified urinary incontinence: R32

## 2019-06-03 SURGERY — ESOPHAGOGASTRODUODENOSCOPY (EGD) WITH PROPOFOL
Anesthesia: General

## 2019-06-03 MED ORDER — PROPOFOL 500 MG/50ML IV EMUL
INTRAVENOUS | Status: DC | PRN
  Administered 2019-06-03: 160 ug/kg/min via INTRAVENOUS

## 2019-06-03 MED ORDER — PROPOFOL 10 MG/ML IV BOLUS
INTRAVENOUS | Status: DC | PRN
  Administered 2019-06-03: 70 mg via INTRAVENOUS

## 2019-06-03 MED ORDER — SODIUM CHLORIDE 0.9 % IV SOLN
INTRAVENOUS | Status: DC
  Administered 2019-06-03: 12:00:00 via INTRAVENOUS

## 2019-06-03 NOTE — Transfer of Care (Signed)
Immediate Anesthesia Transfer of Care Note  Patient: Kimberly Page  Procedure(s) Performed: ESOPHAGOGASTRODUODENOSCOPY (EGD) WITH PROPOFOL (N/A )  Patient Location: PACU  Anesthesia Type:General  Level of Consciousness: drowsy  Airway & Oxygen Therapy: Patient Spontanous Breathing and Patient connected to nasal cannula oxygen  Post-op Assessment: Report given to RN and Post -op Vital signs reviewed and stable  Post vital signs: Reviewed and stable  Last Vitals:  Vitals Value Taken Time  BP    Temp    Pulse    Resp    SpO2      Last Pain:  Vitals:   06/03/19 1158  TempSrc: Tympanic  PainSc: 0-No pain         Complications: No apparent anesthesia complications

## 2019-06-03 NOTE — Op Note (Signed)
[facility] Gastroenterology Patient Name: Kimberly Page Procedure Date: 06/03/2019 12:07 PM MRN: [id number] Account #: [id number] Date of Birth: 10-28-1946 Admit Type: Outpatient Age: 72 Room: [facility] ENDO ROOM 4 Gender: Female Note Status: Finalized Procedure:            Upper GI endoscopy Indications:          Gastro-esophageal reflux disease, Preoperative                        assessment, Hiatal hernia Providers:            Lollie Sails, MD Medicines:            Monitored Anesthesia Care Complications:        No immediate complications. Procedure:            Pre-Anesthesia Assessment:                       - ASA Grade Assessment: III - A patient with severe                        systemic disease.                       After obtaining informed consent, the endoscope was                        passed under direct vision. Throughout the procedure,                        the patient's blood pressure, pulse, and oxygen                        saturations were monitored continuously. The Endoscope                        was introduced through the mouth, and advanced to the                        third part of duodenum. The upper GI endoscopy was                        accomplished without difficulty. The patient tolerated                        the procedure well. Findings:      The Z-line was irregular and was found 34 cm from the incisors. Biopsies       were taken with a cold forceps for histology.      The exam of the esophagus was otherwise normal.      Patchy minimal inflammation characterized by congestion (edema) and       erythema was found in the prepyloric region of the stomach. Biopsies       were taken with a cold forceps for histology.      A moderate to large hiatal hernia [Cameron ulcers] was found below/ at       the location of the diaphragmatic hiatus. The proximal extent of the       gastric folds (end of tubular esophagus) was 34 cm from the  incisors.       The hiatal narrowing was 38 cm from the  incisors. The Z-line was a       variable distance from incisors; the hiatal hernia was sliding. Gastric       mucosal biopsies were taken from the antrum/prepyloric, mid body (below       diaphragmatic hiatus) and from above the diaphragmatic hiatus location.      The examined duodenum was normal. Impression:           - Z-line irregular, 34 cm from the incisors. Biopsied.                       - Gastritis. Biopsied.                       - Large hiatal hernia with multiple Cameron ulcers.                       - Normal examined duodenum. Recommendation:       - Discharge patient to home.                       - Await pathology results.                       - Use Protonix (pantoprazole) 40 mg PO BID daily. Procedure Code(s):    --- Professional ---                       [phone], Esophagogastroduodenoscopy, flexible, transoral;                        with biopsy, single or multiple Diagnosis Code(s):    --- Professional ---                       K22.8, Other specified diseases of esophagus                       K29.70, Gastritis, unspecified, without bleeding                       K44.9, Diaphragmatic hernia without obstruction or                        gangrene                       K25.9, Gastric ulcer, unspecified as acute or chronic,                        without hemorrhage or perforation                       K21.9, Gastro-esophageal reflux disease without                        esophagitis                       Z01.818, Encounter for other preprocedural examination CPT copyright 2019 American Medical Association. All rights reserved. The codes documented in this report are preliminary and upon coder review may  be revised to meet current compliance requirements. Lollie Sails, MD 06/03/2019 1:10:07 PM This report has been signed electronically. Number of Addenda: 0 Note Initiated On: 06/03/2019 12:07 PM  [facility]

## 2019-06-03 NOTE — Anesthesia Preprocedure Evaluation (Signed)
Anesthesia Evaluation  Patient identified by MRN, date of birth, ID band Patient awake    Reviewed: Allergy & Precautions, H&P , NPO status , Patient's Chart, lab work & pertinent test results, reviewed documented beta blocker date and time   History of Anesthesia Complications Negative for: history of anesthetic complications  Airway Mallampati: I  TM Distance: >3 FB Neck ROM: full    Dental  (+) Upper Dentures, Lower Dentures, Edentulous Upper, Edentulous Lower, Dental Advidsory Given   Pulmonary shortness of breath and with exertion, COPD, neg recent URI, former smoker,           Cardiovascular Exercise Tolerance: Good hypertension, (-) angina(-) CAD, (-) Past MI, (-) Cardiac Stents and (-) CABG + dysrhythmias Atrial Fibrillation (-) Valvular Problems/Murmurs     Neuro/Psych negative neurological ROS  negative psych ROS   GI/Hepatic Neg liver ROS, GERD  ,  Endo/Other  negative endocrine ROS  Renal/GU negative Renal ROS  negative genitourinary   Musculoskeletal   Abdominal   Peds  Hematology  (+) Blood dyscrasia, anemia ,   Anesthesia Other Findings Past Medical History: No date: A-fib (HCC) No date: Carpal tunnel syndrome No date: COPD (chronic obstructive pulmonary disease) (HCC) No date: Dysrhythmia     Comment:  atrial fibrillation No date: Fibrocystic breast disease in female No date: Hemangioma of liver No date: Hypertension No date: Urinary incontinence No date: Vertigo   Reproductive/Obstetrics negative OB ROS                             Anesthesia Physical Anesthesia Plan  ASA: II  Anesthesia Plan: General   Post-op Pain Management:    Induction: Intravenous  PONV Risk Score and Plan: 3 and Propofol infusion and TIVA  Airway Management Planned: Natural Airway and Nasal Cannula  Additional Equipment:   Intra-op Plan:   Post-operative Plan:   Informed  Consent: I have reviewed the patients History and Physical, chart, labs and discussed the procedure including the risks, benefits and alternatives for the proposed anesthesia with the patient or authorized representative who has indicated his/her understanding and acceptance.     Dental Advisory Given  Plan Discussed with: Anesthesiologist, CRNA and Surgeon  Anesthesia Plan Comments:         Anesthesia Quick Evaluation

## 2019-06-03 NOTE — H&P (Signed)
Outpatient [place] stay form Pre-procedure 06/03/2019 12:35 PM Lollie Sails MD  Primary Physician: Dr. Caroleen Hamman  Reason for visit: EGD  History of present illness: Patient is a 73 year old female presenting today for an EGD in regards to her history of gastroesophageal reflux and hiatal hernia.  She had a barium swallow on 05/30/2019 showing severe reflux and about a third of the stomach was intrathoracic.  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  This is a preoperative evaluation as she is anticipating to have this hiatal hernia surgically repaired with Dr. Dahlia Byes.  Does take Prilosec 80 mg daily.  She also takes a daily 81 mg aspirin but denies use of any other aspirin products or blood thinning agents.    Current Facility-Administered Medications:  .  0.9 %  sodium chloride infusion, , Intravenous, Continuous, Lollie Sails, MD, Last Rate: 20 mL/hr at 06/03/19 1213  Medications Prior to Admission  Medication Sig Dispense Refill Last Dose  . acetaminophen (TYLENOL) 500 MG tablet Take 1,000 mg by mouth every 6 (six) hours as needed.     Marland Kitchen albuterol (PROVENTIL HFA;VENTOLIN HFA) 108 (90 Base) MCG/ACT inhaler Inhale 2 puffs into the lungs every 6 (six) hours as needed for wheezing or shortness of breath. 18 g 2 06/02/2019 at Unknown time  . aspirin 81 MG tablet Take 81 mg by mouth daily.   Past Week at Unknown time  . CALCIUM-VITAMIN D PO Take 600 mg by mouth 2 (two) times daily.   Past Week at Unknown time  . Cyanocobalamin (VITAMIN B-12 PO) Take 1 tablet by mouth daily. At lunch   Past Week at Unknown time  . famotidine (PEPCID) 20 MG tablet Take 20 mg by mouth daily.   06/02/2019 at Unknown time  . Magnesium 250 MG TABS Take 2 tablets by mouth daily. With Lunch    Past Week at Unknown time  . meclizine (ANTIVERT) 25 MG tablet Take 25 mg by mouth as needed for dizziness.   Past Month at Unknown time  . metoprolol succinate (TOPROL-XL) 50 MG 24 hr tablet Take 50 mg by  mouth 2 (two) times daily. Take with or immediately following a meal. Per patient: 1 tab in morning and 1 tab at night   06/03/2019 at Unknown time  . naproxen sodium (ALEVE) 220 MG tablet Take 440 mg by mouth 2 (two) times daily with a meal.   Past Month at Unknown time  . omeprazole (PRILOSEC) 40 MG capsule Take 40 mg by mouth daily.   06/02/2019 at Unknown time  . oxybutynin (DITROPAN) 5 MG tablet Take 5 mg by mouth 2 (two) times daily.   06/02/2019 at Unknown time  . Sodium Chloride, Inhalant, (HYPER-SAL) 7 % NEBU Inhale 4 mLs into the lungs daily.     . vitamin E 200 UNIT capsule Take 200 Units by mouth 4 (four) times a week. Takes Sundays, Mondays, Tuesdays and Wednesdays.   Past Week at Unknown time  . WIXELA INHUB 250-50 MCG/DOSE AEPB Inhale 1 puff into the lungs 2 (two) times daily. 60 each 2 06/02/2019 at Unknown time     Allergies  Allergen Reactions  . Naproxen Other (See Comments)    passed out  . Propoxyphene Other (See Comments)  . Pseudoephedrine Hcl Palpitations  . Pseudoephedrine Hcl Palpitations     Past Medical History:  Diagnosis Date  . A-fib ([place])   . Carpal tunnel syndrome   . COPD (chronic obstructive pulmonary disease) ([place])   .  Dysrhythmia    atrial fibrillation  . Fibrocystic breast disease in female   . Hemangioma of liver   . Hypertension   . Urinary incontinence   . Vertigo     Review of systems:      Physical Exam    Heart and lungs: Regular rate and rhythm without rub or gallop lungs are bilaterally clear    HEENT: Normocephalic atraumatic eyes are anicteric    Other:    Pertinant exam for procedure: Soft nontender nondistended bowel sounds positive normoactive    Planned proceedures: EGD and indicated procedures. I have discussed the risks benefits and complications of procedures to include not limited to bleeding, infection, perforation and the risk of sedation and the patient wishes to proceed.    Lollie Sails,  MD Gastroenterology 06/03/2019  12:35 PM

## 2019-06-03 NOTE — Anesthesia Post-op Follow-up Note (Signed)
Anesthesia QCDR form completed.        

## 2019-06-05 NOTE — Anesthesia Postprocedure Evaluation (Signed)
Anesthesia Post Note  Patient: Kimberly Page  Procedure(s) Performed: ESOPHAGOGASTRODUODENOSCOPY (EGD) WITH PROPOFOL (N/A )  Patient location during evaluation: Endoscopy Anesthesia Type: General Level of consciousness: awake and alert Pain management: pain level controlled Vital Signs Assessment: post-procedure vital signs reviewed and stable Respiratory status: spontaneous breathing, nonlabored ventilation, respiratory function stable and patient connected to nasal cannula oxygen Cardiovascular status: blood pressure returned to baseline and stable Postop Assessment: no apparent nausea or vomiting Anesthetic complications: no     Last Vitals:  Vitals:   06/03/19 1323 06/03/19 1335  BP: 126/70 (!) 145/78  Pulse: (!) 57 (!) 56  Resp: 15 16  Temp:    SpO2: 100% 100%    Last Pain:  Vitals:   06/04/19 1246  TempSrc:   PainSc: 0-No pain                 Martha Clan

## 2019-06-06 ENCOUNTER — Ambulatory Visit: Payer: Medicare Other | Admitting: Pulmonary Disease

## 2019-06-07 LAB — SURGICAL PATHOLOGY

## 2019-06-13 ENCOUNTER — Encounter: Payer: Self-pay | Admitting: Surgery

## 2019-06-13 ENCOUNTER — Other Ambulatory Visit: Payer: Self-pay

## 2019-06-13 ENCOUNTER — Encounter: Payer: Self-pay | Admitting: *Deleted

## 2019-06-13 ENCOUNTER — Ambulatory Visit (INDEPENDENT_AMBULATORY_CARE_PROVIDER_SITE_OTHER): Payer: Medicare Other | Admitting: Surgery

## 2019-06-13 VITALS — BP 165/86 | HR 60 | Temp 97.7°F | Ht 65.0 in | Wt 186.0 lb

## 2019-06-13 DIAGNOSIS — K449 Diaphragmatic hernia without obstruction or gangrene: Secondary | ICD-10-CM | POA: Diagnosis not present

## 2019-06-13 NOTE — Progress Notes (Signed)
Outpatient Surgical Follow Up  06/13/2019  Kimberly Page is an 73 y.o. female.   Chief Complaint  Patient presents with  . Follow-up    HPI: 73 year old female known to me for a symptomatic hiatal hernia and significant reflux.  She does have a history of COPD with multiple inhalers she has been found to have microaspiration's that is worsening her respiratory status.  He recently underwent an EGD that I have personally reviewed showing HH, swallow reviewed personally as well, showing HH with reflux, no evidence of esophageal stricture or pathology. Takes xarelto for A fib     Past Medical History:  Diagnosis Date  . A-fib ([place])   . Carpal tunnel syndrome   . COPD (chronic obstructive pulmonary disease) ([place])   . Dysrhythmia    atrial fibrillation  . Fibrocystic breast disease in female   . Hemangioma of liver   . Hypertension   . Urinary incontinence   . Vertigo     Past Surgical History:  Procedure Laterality Date  . BLADDER SUSPENSION  01/24/2008  . BREAST BIOPSY Left 12/2004   core- neg  . CATARACT EXTRACTION Left 06/14/2013  . CATARACT EXTRACTION Right 06/28/2013  . CHOLECYSTECTOMY  08/2008  . COLONOSCOPY    . ESOPHAGOGASTRODUODENOSCOPY (EGD) WITH PROPOFOL N/A 06/03/2019   Procedure: ESOPHAGOGASTRODUODENOSCOPY (EGD) WITH PROPOFOL;  Surgeon: Lollie Sails, MD;  Location: [facility] ENDOSCOPY;  Service: Endoscopy;  Laterality: N/A;  . EYE SURGERY Right 1958  . FOOT SURGERY Right 09/07/14  . HEMORROIDECTOMY  11/1977  . HERNIA REPAIR  01/24/2008  . KNEE SURGERY Left 12/1997  . LARYNGOSCOPY  11/2005  . reactive airway disease    . seasonal allergies    . TYMPANOSTOMY TUBE PLACEMENT  1977  . VAGINAL HYSTERECTOMY  04/1986    Family History  Problem Relation Age of Onset  . Hypertension Mother   . Stroke Mother   . Leukemia Father   . Cancer Other   . Breast cancer Paternal Aunt   . Breast cancer Cousin     Social History:  reports that she quit smoking  about 19 years ago. She quit after 45.00 years of use. She has never used smokeless tobacco. She reports that she does not drink alcohol or use drugs.  Allergies:  Allergies  Allergen Reactions  . Naproxen Other (See Comments)    passed out  . Propoxyphene Other (See Comments)  . Pseudoephedrine Hcl Palpitations  . Pseudoephedrine Hcl Palpitations    Medications reviewed.    ROS Full ROS performed and is otherwise negative other than what is stated in HPI   BP (!) 165/86   Pulse 60   Temp 97.7 F (36.5 C) (Skin)   Ht 5\' 5"  (1.651 m)   Wt 186 lb (84.4 kg)   SpO2 98%   BMI 30.95 kg/m   Physical Exam Vitals signs and nursing note reviewed. Exam conducted with a chaperone present.  Constitutional:      General: She is not in acute distress.    Appearance: Normal appearance.  Eyes:     General: No scleral icterus.       Right eye: No discharge.        Left eye: No discharge.  Neck:     Musculoskeletal: Normal range of motion and neck supple. No neck rigidity or muscular tenderness.  Cardiovascular:     Rate and Rhythm: Normal rate and regular rhythm.     Heart sounds: No murmur.  Pulmonary:  Effort: Pulmonary effort is normal. No respiratory distress.     Breath sounds: No stridor.  Abdominal:     General: Abdomen is flat. There is no distension.     Palpations: There is no mass.     Tenderness: There is no abdominal tenderness. There is no guarding or rebound.     Hernia: No hernia is present.  Musculoskeletal: Normal range of motion.        General: No swelling.  Skin:    General: Skin is warm and dry.     Capillary Refill: Capillary refill takes less than 2 seconds.  Neurological:     General: No focal deficit present.     Mental Status: She is alert and oriented to person, place, and time.  Psychiatric:        Mood and Affect: Mood normal.        Behavior: Behavior normal.        Thought Content: Thought content normal.        Judgment: Judgment  normal.      Assessment/Plan:  73 year old female with moderate hiatal hernia with significant reflux symptoms and microaspiration.  Extensive discussion with the patient regarding the rationale for hiatal hernia repair and fundoplication.  I believe that she does have an indication for surgery.  Procedure discussed with the patient in detail.  Risk, benefits and possible complications including but not limited to: Bleeding, infection, esophageal perforation, recurrence and chronic pain.  She understands and wishes to proceed.  She is in Xarelto for A. fib and we will stop this for 48 hours  Greater than 50% of the 72minutes  visit was spent in counseling/coordination of care   Caroleen Hamman, MD [place] Surgeon

## 2019-06-13 NOTE — Progress Notes (Addendum)
Patient needs to be scheduled for a robotic hiatal hernia repair with Dr. Dahlia Byes. Dr. Genevive Bi will need to assist with the case.   The patient will need pulmonary clearance prior to scheduling surgery.   Patient reports that she sees Dr.Fuad  Aleskerov at [facility] and that her last visit was in May.   Pulmonary clearance request was faxed over to their office today and fax confirmation received.  Patient aware they may be contacting her to come in for another appointment.   We will contact patient to arrange surgery once pulmonary clearance has been received.

## 2019-06-21 ENCOUNTER — Encounter: Payer: Self-pay | Admitting: *Deleted

## 2019-06-21 ENCOUNTER — Telehealth: Payer: Self-pay | Admitting: *Deleted

## 2019-06-21 NOTE — Telephone Encounter (Signed)
Patient contacted today and notified that we received pulmonary clearance from Dr. Ottie Glazier. She is aware to continue salt water nebulizer prior to and after surgery. Also, aware she was cleared at low risk from a pulmonology standpoint.   Patient's surgery to be scheduled for 07-21-19 at [facility] with Dr. Dahlia Byes. Dr. Hampton Abbot will be assisting with this case.   The patient is aware to have COVID-19 testing done on 07-18-19 at the [facility] building drive thru ([address] [place]). She is aware to isolate after, have no visitors, wash hands frequently, and avoid touching face.   The patient is aware she will need to Pre-Admit. Patient will check in at the [place] entrance due to COVID-19 restrictions and will then be escorted to the [place], [address] (first floor). Patient will be contacted once Pre-admission appointment has been arranged with date and time. We will try and coordinate Pre-admit appointment with COVID testing.   Patient will be contacted once posted with [facility] for further instructions.

## 2019-06-21 NOTE — Progress Notes (Signed)
We received pulmonary clearance from Dr. Ottie Glazier who placed patient at low risk for surgery. He does recommend that patient may continue salt water nebulizer prior to and after surgery.   Note routed to Dr. Dahlia Byes.

## 2019-06-22 ENCOUNTER — Telehealth: Payer: Self-pay | Admitting: *Deleted

## 2019-06-22 NOTE — Telephone Encounter (Signed)
Patient contacted today and aware surgery has been scheduled for 07-21-19.   The patient will need to Pre-admit and have COVID testing done on 07-18-19 at 11 am.  Patient aware to call the office should she have further questions. She verbalizes understanding.

## 2019-07-18 ENCOUNTER — Encounter
Admission: RE | Admit: 2019-07-18 | Discharge: 2019-07-18 | Disposition: A | Discharge status: Home / Self Care | Payer: Medicare Other | Source: Ambulatory Visit | Attending: Surgery | Admitting: Surgery

## 2019-07-18 ENCOUNTER — Other Ambulatory Visit: Payer: Self-pay

## 2019-07-18 ENCOUNTER — Telehealth: Payer: Self-pay | Admitting: *Deleted

## 2019-07-18 DIAGNOSIS — Z1159 Encounter for screening for other viral diseases: Secondary | ICD-10-CM | POA: Insufficient documentation

## 2019-07-18 DIAGNOSIS — Z01812 Encounter for preprocedural laboratory examination: Secondary | ICD-10-CM | POA: Insufficient documentation

## 2019-07-18 HISTORY — DX: Other complications of anesthesia, initial encounter: T88.59XA

## 2019-07-18 HISTORY — DX: Unspecified osteoarthritis, unspecified site: M19.90

## 2019-07-18 HISTORY — DX: Gastro-esophageal reflux disease without esophagitis: K21.9

## 2019-07-18 HISTORY — DX: Family history of other specified conditions: Z84.89

## 2019-07-18 HISTORY — DX: Other specified mononeuropathies of left upper limb: G56.82

## 2019-07-18 LAB — SARS CORONAVIRUS 2 (TAT 6-24 HRS): SARS Coronavirus 2: NEGATIVE

## 2019-07-18 NOTE — Telephone Encounter (Signed)
Patient instructed to stop her 81 mg Aspirin.

## 2019-07-18 NOTE — Telephone Encounter (Signed)
Patient called and went to pre admit today and she stated that they told her to contact our office to see if she needs to stop her 81mg  aspirin before surgery on Thursday 07/21/19 with Dr.Pabon

## 2019-07-18 NOTE — Pre-Procedure Instructions (Signed)
Progress Notes - documented in this encounter Ottie Glazier, MD - 05/10/2019 11:00 AM EDT Formatting of this note might be different from the original.     DIVISION OF PULMONARY AND CRITICAL CARE MEDICINE INITIAL PATIENT ENCOUNTER   Chief Complaint: Abnormal CT chest  HPI :  Ms. Kimberly Page is 73 y.o. female who was referred to [facility] due to bilateral pulmonary emboli. Pt has a distant hx of smoking in past with COPD. She started smoking at age 34 and stopped at age 62 with 2 packs per day for total of appx 72 pack years. She feels her breathing has become worse over past year. She does not bring up phelgm but does cough daily which bothers her due to stress incontinence. Currently she is able to walk appx 30 minutes at slow pace to complete 1 mile and has been able to do this daily for past 2 weeks. She has not been cave exploring ever, has not had birds in the past, has not worked in the soil with vegetation, denies hx of fungal disease. She does endorse significant reaction to cat dander. She had pneumonia 2 months ago and responded well to abx and pred taper. 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 She has severe GERD and hiatal hernia currently on prilosec and H2 blocker. She denies constitutional symptoms, has not had any new lumps in groin or axilla.   Past medical History: She has a past medical history of Arrhythmia, Atrial fibrillation (CMS-HCC), Carpal tunnel syndrome, Chronic pain of right lower extremity (10/22/2016), COPD (chronic obstructive pulmonary disease) (CMS-HCC), Fibrocystic breast disease, Hemangioma of liver (11/06), PAT (paroxysmal atrial tachycardia) (CMS-HCC), RAD (reactive airway disease), moderate persistent, uncomplicated (70/0/1749), Seasonal allergies, and Tobacco use.  is allergic to darvon [propoxyphene]; naprosyn  [naproxen]; and sudafed [pseudoephedrine hcl].  ROS: ROS 10 point ROS neg except as per HPI Remainder has been reviewed and is negative except as per HPI  Current Medications (including changes made at this visit) Current Outpatient Medications  Medication Sig Dispense Refill  . acetaminophen (TYLENOL) 500 MG tablet Take 1,000 mg by mouth as needed for Pain.  Marland Kitchen albuterol 90 mcg/actuation inhaler Inhale 2 inhalations into the lungs every 6 (six) hours as needed for Wheezing. 1 Inhaler 11  . aspirin 81 MG chewable tablet Take 81 mg by mouth once daily.  . calcium carbonate-vitamin D3 (CALTRATE 600+D) 600 mg(1,564m) -400 unit tablet Take 1 tablet by mouth 2 (two) times daily with meals.  . cyanocobalamin/cobamamide (B12 SL) Place under the tongue Under tongue once daily  . famotidine (PEPCID) 20 MG tablet Take 40 mg by mouth nightly  . fluticasone propion/salmeterol (ADVAIR DISKUS INHAL) Inhale into the lungs One puff as needed  . magnesium 250 mg Tab Take 500 mg by mouth once daily  . meclizine (ANTIVERT) 25 mg tablet Take 1 tablet (25 mg total) by mouth 4 (four) times daily as needed for Dizziness. 100 tablet 5  . metoprolol succinate (TOPROL-XL) 50 MG XL tablet TAKE 1 TABLET BY MOUTH TWICE DAILY 180 tablet 3  . omeprazole (PRILOSEC) 40 MG DR capsule Take 1 capsule (40 mg total) by mouth once daily 90 capsule 3  . oxybutynin (DITROPAN) 5 mg tablet TAKE 1 TABLET BY MOUTH TWICE A DAY 180 tablet 3  . vitamin E 200 UNIT capsule Take 200 Units by mouth once a week Take 1 capsule by  mouth daily 4 days a week   Current Facility-Administered Medications  Medication Dose Route Frequency Provider Last Rate Last Dose  . cyanocobalamin (VITAMIN B12) injection 1,000 mcg 1,000 mcg Intramuscular Q30 Days Yevonne Pax, MD 1,000 mcg at 01/30/16 1051   Social History: She reports that she quit smoking about 18 years ago. Her smoking use included cigarettes. She has a 90.00 pack-year smoking  history. She has never used smokeless tobacco. She reports current alcohol use. She reports that she does not use drugs.  Surgical History: has a past surgical history that includes Enterocele ligation; Cholecystectomy (08/2008); Colonoscopy (2001); RIGHT EYE SURGERY; Tubal ligation (1977); HEMORRHOIDECTOMY SURGERY (11/1977); Hysterectomy (04/1986); laparoscopy diagnostic (03/1993); LEFT KNEE ARTHROSCOPY (12/1997); BREAST BIOPSY PROCEDURE (12/2004); Laryngoscopy (11/2005); Cataract extraction (Left, 06/14/2013); Cataract extraction (Right, 06/28/2013); HERNIA REPAIR AND BLADDER SLING (01/2008); and Right Foot Surgery (Right, 09/07/2014).  Family History: Her family history includes Leukemia in her father; Stroke in her mother.  Physical Exam: BP 149/70  Pulse 60  Temp 36.8 C (98.2 F) (Oral)  Wt 84.4 kg (186 lb)  LMP (LMP Unknown)  SpO2 97%  BMI 31.91 kg/m  GENERAL: NAD, able to speak in complete sentences without dyspnea or cough HEENT: normocephalic, PERRL, EOMI, TM with sharp light reflex bilaterally. Normal external auditory canal. No hypertrophy of nasal turbinates. Clear mucosa in mouth without any exudates or erythema NECK: Supple, no jugular venous distension, nodes, stridor nor thyromegaly. Trachea midline.  CARDIOVASCULAR: RRR, no murmurs, gallops, rubs RESPIRATORY: normal respiratory effort, clear to auscultation bilaterally. No crackles, or wheezes GASTROINTESTINAL: NABS, soft, non tender, non distended EXTR: No edema, homans sign or cyanosis LYMPHATIC: No nodes in neck or supraclavicular areas INTEGUMENTARY: No rashes, bruising, telantagias, sclerodactaly nor alopecia NEURO: Cranial nerves, motor, sensation intact. Normal gait  Labs & Imaging:   CLINICAL DATA: Follow-up lung nodules  EXAM: CT CHEST WITHOUT CONTRAST  TECHNIQUE: Multidetector CT imaging of the chest was performed following the standard protocol without IV contrast.  COMPARISON: 03/22/2010   FINDINGS: Cardiovascular: No significant vascular findings. Normal heart size. No pericardial effusion.  Mediastinum/Nodes: No enlarged mediastinal, hilar, or axillary lymph nodes. Thyroid gland, trachea, and esophagus demonstrate no significant findings. Large, debris filled hiatal hernia.  Lungs/Pleura: There are numerous bilateral small pulmonary nodules and consolidations generally unchanged compared to prior examination dated 2011. There is a lobulated nodule or consolidation of the right lung base measuring 1.3 cm, not significantly changed compared to remote prior examination (series 3, image 94). No pleural effusion or pneumothorax.  Upper Abdomen: No acute abnormality.  Musculoskeletal: No chest wall mass or suspicious bone lesions identified.  IMPRESSION: 1. There are numerous bilateral small pulmonary nodules and consolidations generally unchanged compared to prior examination dated 2011. Some of these nodules are not clearly visualized on prior examination, possibly due to slice selection and 5 mm CT slice reconstruction technique at that time. There is a lobulated nodule or consolidation of the right lung base measuring 1.3 cm, not significantly changed compared to remote prior examination (series 3, image 94). 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 Consider CT follow-up in 6-12 months if clinically indicated.  2. Large, debris-filled hiatal hernia.   I have personally reviewed all above lab data and Imaging today and discussed pertinent findings with patient.   MedicalDecision Making:   Impression/Plan:   Pulmonary Nodules - mostly chronic unchanged in size well circumscribed non spiculated benign appearing - largest 1.3cm at RLL with most recent  interval comparison at 11/2018 - 1.2cm  COPD grade 1 B Current inhalers:  Smoking history and Cessation:  Social History   Tobacco  Use  Smoking Status Former Smoker  . Packs/day: 2.00  . Years: 45.00  . Pack years: 90.00  . Types: Cigarettes  . Last attempt to quit: 05/23/2000  . Years since quitting: 18.9  Smokeless Tobacco Never Used  Counseling given: Not Answered  PFTs: Reviewed with patient today  Significant Imaging and Lab findings: none  Recent respiratory hospitalization None  Phenotype:  GERD complaints during visit today: yes severe  Depression screening PHQ 2/9 last 3 flowsheet values  PHQ-2/9 Depression Screening 04/27/2019  Over the last 2 weeks, have you experienced little pleasure or interest in doing things? 0  Feeling down, depressed, or hopeless (or irritable for Teens only)? 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 as per radiologist from CT reports - currently patient is having dry cough without phlegm production  - we discussed possibly needing BAL in future via bronchoscopy - will initiate bronchopulmonary hygiene maneuvers to clear airways of phlegm/infection via hypertonic saline nebulized  Large Hiatal hernia  -Patient still has symptoms of GERD despite PPI and H2 blocker daily -Potential for chronic microaspiration with formation of lung scarring/nodules over time -Discussed possible benefits of surgical consultation for hiatal hernia patient is agreeable and interested will place referral for Dr. Perrin Maltese surgery  RTC in 6 months post nodule follow up CT chest   Patient relates understanding our  goals for this visit and is agreeable to above plans.   Claudette Stapler, MD [place]  Division of Pulmonary & Critical Care Medicine   Electronically signed by Ottie Glazier, MD at 05/10/2019 12:14 PM EDT   Miscellaneous Notes - documented in this encounter Table of Contents for Miscellaneous Notes  Addendum Note - Michaelyn Barter, RN - 05/10/2019 11:00 AM EDT  Addendum Note - Michaelyn Barter, RN - 05/10/2019 11:00 AM EDT    Addendum Note Michaelyn Barter, RN - 05/10/2019 11:00 AM EDT Addended by: Michaelyn Barter on: 05/10/2019 04:43 PM  Modules accepted: Orders    Electronically signed by Michaelyn Barter, RN at 05/10/2019 4:43 PM EDT  Back to top of Miscellaneous Notes Addendum Note - Michaelyn Barter, RN - 05/10/2019 11:00 AM EDT Addended by: Michaelyn Barter on: 05/10/2019 12:55 PM  Modules accepted: Orders    Electronically signed by Michaelyn Barter, RN at 05/10/2019 12:55 PM EDT  Back to top of Miscellaneous Notes  Plan of Treatment - documented as of this encounter Upcoming Encounters Upcoming Encounters  Date Type Specialty Care Team Description  06/20/2019 Office Visit Cardiology Fath, Aloha Gell, MD  [address]  [place], [place] [address]  [phone]  [phone] (Fax)    11/04/2019 Ancillary Orders Lab Yevonne Pax, MD  [place]  [facility] [place]  [place], [place] [address]  [phone]  [phone] (Fax)    11/11/2019 Office Visit Internal Medicine Yevonne Pax, MD  [place]  [place],  [place] [address]  [phone]  [phone] (Fax)    11/14/2019 Office Visit Pulmonary Ottie Glazier, MD  [place], [place] [address]  [phone]  [phone] (Fax)     Scheduled Orders Scheduled Orders  Name Type Priority Associated Diagnoses Order Schedule  CT chest without contrast Imaging  3-Outpatient Routine SOB (shortness of breath)  Expected: 11/10/2019, Expires: 05/09/2020   Scheduled Referrals Scheduled Referrals  Name Type Priority Associated Diagnoses Order Schedule  Ambulatory Referral to General Surgery Outpatient Referral Routine Pulmonary nodules  Ordered: 05/10/2019   Goals - documented as of this encounter Goal Patient Goal Type Associated Problems Recent Progress Patient-Stated? Author  Lose Weight  Weight  On track (10/27/2018 3:46 PM EST) Yes Bernarda Caffey, CMA  Note:   Patient would like to lose 40 pounds by reducing her friend food intake as well as eating more at home.    Procedures - documented in this encounter Procedure Name Priority Date/Time Associated Diagnosis Comments  ORDER  05/10/2019 12:00 AM EDT  Results for this procedure are in the results section.    Miscellaneous Results - documented in this encounter  ORDER (05/10/2019 12:00 AM EDT) ORDER (05/10/2019 12:00 AM EDT)  Narrative Performed At  This result has an attachment that is not available.  Ordered by an unspecified provider.       Visit Diagnoses - documented in this encounter Diagnosis  Pulmonary nodules - Primary  Other diseases of lung, not elsewhere classified   SOB (shortness of breath)  Shortness of breath    Discontinued Medications - documented as of this encounter Medication Sig Discontinue Reason Start Date End Date  predniSONE (DELTASONE) 10 MG tablet  Indications: Atypical chest pain, Thoracic nerve root impingement 6 pills x 1 day, 5 pills x 1 day, 4 pills x 1 day, 3 pills x 1 day, 2 pills x 1 day. 1 pill x 1 day. Therapy completed 03/04/2019 05/10/2019  ferrous gluconate (FERGON) 324 MG tablet  Take 1 tablet (324 mg total) by mouth daily with breakfast DC'd other provider 11/30/2018 05/10/2019  Orders - documented in this encounter General Supply Count Last Ordered Date First Ordered Date  NEBULIZER AEROSOLE COMPRESSOR 1 05/10/2019    Images  Patient Contacts  Contact Name Contact Address Communication Relationship to Patient  Kurtis Bushman Unknown [phone] [facility]) Son or Daughter, Emergency Contact  Document Information  Primary Care Provider Other Service Providers Document Coverage Dates  Yevonne Pax, MD (Apr. 22, 2015April 22, 2015 - Present) DM: [phone] (Work) [phone] (Fax) [facility] [place], [place] [address] [place] [address] [place], [place] [address]  May 19, 2020May 19, 2020   [place] [address] [place], [place] [address]   Encounter Providers Encounter Date  Ottie Glazier, MD (Attending) DM: [phone] (Work) [phone] (Fax) [place], [place] [address] Pulmonary Disease May 19, 2020May 19, 2020

## 2019-07-18 NOTE — Pre-Procedure Instructions (Signed)
Kimberly Levans, MD - 06/20/2019 4:00 PM EDT Formatting of this note might be different from the original.   Chief Complaint: Chief Complaint  Patient presents with  . 6 month follow up  Date of Service: 06/20/2019 Date of Birth: 1946-05-30 PCP: Yevonne Pax, MD  History of Present Illness: Ms. Valiente is a 73 y.o.female patient who presents for follow-up visit. She was initially seen for her tachycardia arrhythmias. These have not been a problem recently. She is on Toprol at 50 mg twice daily. She does not have a breakthrough preparation. She takes or uses a Valsalva maneuver if she has breakthrough. . She got symptomatically bradycardic with 75 mg of Toprol twice daily. Exercise stress echo showed no significant ischemia or structural abnormalities. However since she continued to have symptoms and subsequently underwent a CT angiogram of the heart which revealed no significant coronary disease however there were several bilateral pulmonary nodules largest in the right lower lobe. Recommended follow-up with CT of the chest in 3 months or PET/CT. He is following up with luminary and the nodules and not grown however a large hiatal hernia is present. She is now being worked up for this. Past Medical and Surgical History  Past Medical History Past Medical History:  Diagnosis Date  . Arrhythmia  . Atrial fibrillation (CMS-HCC)  . Carpal tunnel syndrome  . Chronic pain of right lower extremity 10/22/2016  . COPD (chronic obstructive pulmonary disease) (CMS-HCC)  mild by chest xray  . Fibrocystic breast disease  . Hemangioma of liver 11/06  Right liver lobe hemangioma  . PAT (paroxysmal atrial tachycardia) (CMS-HCC)  History of PAT  . RAD (reactive airway disease), moderate persistent, uncomplicated 36/05/2946  PFTs showed FEV1 1.6, DLCO 60% 9/18, normal chest x-ray  . Seasonal allergies  . Tobacco use  The patient quit.   Past Surgical History She has a past surgical history that  includes Enterocele ligation; Cholecystectomy (08/2008); Colonoscopy (2001); RIGHT EYE SURGERY; Tubal ligation (1977); HEMORRHOIDECTOMY SURGERY (11/1977); Hysterectomy (04/1986); laparoscopy diagnostic (03/1993); LEFT KNEE ARTHROSCOPY (12/1997); BREAST BIOPSY PROCEDURE (12/2004); Laryngoscopy (11/2005); Cataract extraction (Left, 06/14/2013); Cataract extraction (Right, 06/28/2013); HERNIA REPAIR AND BLADDER SLING (01/2008); Right Foot Surgery (Right, 09/07/2014); Colonoscopy; and egd (06/03/2019).   Medications and Allergies  Current Medications  Current Outpatient Medications  Medication Sig Dispense Refill  . acetaminophen (TYLENOL) 500 MG tablet Take 1,000 mg by mouth as needed for Pain.  Marland Kitchen albuterol 90 mcg/actuation inhaler Inhale 2 inhalations into the lungs every 6 (six) hours as needed for Wheezing. 1 Inhaler 11  . aspirin 81 MG chewable tablet Take 81 mg by mouth once daily.  . calcium carbonate-vitamin D3 (CALTRATE 600+D) 600 mg(1,500mg ) -400 unit tablet Take 1 tablet by mouth 2 (two) times daily with meals.  . cyanocobalamin/cobamamide (B12 SL) Place under the tongue Under tongue once daily  . famotidine (PEPCID) 20 MG tablet Take 40 mg by mouth nightly  . fluticasone propion/salmeterol (ADVAIR DISKUS INHAL) Inhale into the lungs One puff as needed  . magnesium 250 mg Tab Take 500 mg by mouth once daily  . meclizine (ANTIVERT) 25 mg tablet Take 1 tablet (25 mg total) by mouth 4 (four) times daily as needed for Dizziness. 100 tablet 5  . metoprolol succinate (TOPROL-XL) 50 MG XL tablet TAKE 1 TABLET BY MOUTH TWICE DAILY 180 tablet 3  . oxybutynin (DITROPAN) 5 mg tablet Take 1 tablet (5 mg total) by mouth 2 (two) times daily 180 tablet 3  . pantoprazole (PROTONIX) 40  MG DR tablet TAKE 1 TABLET(40 MG) BY MOUTH TWICE DAILY 30 MINUTES BEFORE A MEAL 180 tablet 3  . sodium chloride (HYPERSAL) 7 % nebulizer solution Take 4 mLs by nebulization once daily 120 mL 11  . vitamin E 200 UNIT capsule  Take 200 Units by mouth once a week Take 1 capsule by mouth daily 4 days a week   Current Facility-Administered Medications  Medication Dose Route Frequency Provider Last Rate Last Dose  . cyanocobalamin (VITAMIN B12) injection 1,000 mcg 1,000 mcg Intramuscular Q30 Days Yevonne Pax, MD 1,000 mcg at 01/30/16 1051   Allergies: Darvon [propoxyphene]; Naprosyn [naproxen]; and Sudafed [pseudoephedrine hcl]  Social and Family History  Social History reports that she quit smoking about 19 years ago. Her smoking use included cigarettes. She has a 90.00 pack-year smoking history. She has never used smokeless tobacco. She reports current alcohol use. She reports that she does not use drugs.  Family History Family History  Problem Relation Age of Onset  . Stroke Mother  . Leukemia Father   Review of Systems  Review of Systems  Constitutional: Negative for chills, diaphoresis, fever, malaise/fatigue and weight loss.  HENT: Negative for congestion, ear discharge, hearing loss and tinnitus.  Eyes: Negative for blurred vision.  Respiratory: Positive for shortness of breath and wheezing. Negative for cough, hemoptysis and sputum production.  Cardiovascular: Negative for palpitations, orthopnea, claudication, leg swelling and PND.  Gastrointestinal: Negative for abdominal pain, blood in stool, constipation, diarrhea, heartburn, melena, nausea and vomiting.  Genitourinary: Negative for dysuria, frequency, hematuria and urgency.  Musculoskeletal: Negative for back pain, falls, joint pain and myalgias.  Skin: Negative for itching and rash.  Neurological: Negative for dizziness, tingling, focal weakness, loss of consciousness, weakness and headaches.  Endo/Heme/Allergies: Negative for polydipsia. Does not bruise/bleed easily.  Psychiatric/Behavioral: Negative for depression, memory loss and substance abuse. The patient is not nervous/anxious.   Physical Examination   Vitals:BP 120/64  Pulse  64  Resp 16  Ht 162.6 cm (5\' 4" )  Wt 84.5 kg (186 lb 3.2 oz)  LMP (LMP Unknown)  BMI 31.96 kg/m  Ht:162.6 cm (5\' 4" ) Wt:84.5 kg (186 lb 3.2 oz) DQQ:IWLN surface area is 1.95 meters squared. Body mass index is 31.96 kg/m.  Wt Readings from Last 3 Encounters:  06/20/19 84.5 kg (186 lb 3.2 oz)  05/26/19 83.5 kg (184 lb)  05/10/19 84.4 kg (186 lb)   BP Readings from Last 3 Encounters:  06/20/19 120/64  05/26/19 127/77  05/10/19 149/70   General appearance appears in no acute distress  Head Mouth and Eye exam Normocephalic, without obvious abnormality, atraumatic Dentition is good Eyes appear anicteric   LUNGS Breath Sounds: Normal Percussion: Normal  CARDIOVASCULAR JVP CV wave: no HJR: no Elevation at 90 degrees: None Carotid Pulse: normal pulsation bilaterally Bruit: None Apex: apical impulse normal  Auscultation Rhythm: normal sinus rhythm S1: normal S2: normal Clicks: no Rub: no Murmurs: no murmurs  Gallop: None ABDOMEN Liver enlargement: no Pulsatile aorta: no Ascites: no Bruits: no  EXTREMITIES Clubbing: no Edema: trace to 1+ bilateral pedal edema Pulses: peripheral pulses symmetrical Femoral Bruits: no Amputation: no SKIN Rash: no Cyanosis: no Embolic phemonenon: no Bruising: no NEURO Alert and Oriented to person, place and time: yes Non focal: yes  PSYCH: Pt appears to have normal affect  LABS REVIEWED Last 3 CBC results: Lab Results  Component Value Date  WBC 5.1 04/20/2019  WBC 6.8 10/20/2018  WBC 4.4 04/19/2018   Lab Results  Component  Value Date  HGB 13.2 04/20/2019  HGB 13.2 01/11/2019  HGB 11.4 (L) 11/26/2018   Lab Results  Component Value Date  HCT 40.4 04/20/2019  HCT 36.7 10/20/2018  HCT 38.0 04/19/2018   Lab Results  Component Value Date  PLT 234 04/20/2019  PLT 343 10/20/2018  PLT 300 04/19/2018   Lab Results  Component Value Date  CREATININE 0.7 04/20/2019  BUN 20 04/20/2019  NA 142 04/20/2019  K  4.8 04/20/2019  CL 106 04/20/2019  CO2 30.6 04/20/2019   No results found for: HGBA1C  Lab Results  Component Value Date  HDL 56.4 04/20/2019  HDL 58.9 10/20/2018  HDL 48.8 04/19/2018   Lab Results  Component Value Date  LDLCALC 161 (H) 04/20/2019  LDLCALC 143 (H) 10/20/2018  LDLCALC 152 (H) 04/19/2018   Lab Results  Component Value Date  TRIG 147 04/20/2019  TRIG 121 10/20/2018  TRIG 171 04/19/2018   Lab Results  Component Value Date  ALT 20 04/20/2019  AST 17 04/20/2019  ALKPHOS 89 04/20/2019   Lab Results  Component Value Date  TSH 3.380 10/20/2018   Diagnostic Studies Reviewed:  EKG EKG demonstrated normal sinus rhythm, nonspecific ST and T waves changes.  Assessment and Plan   73 y.o. female with  ICD-10-CM ICD-9-CM  1. Combined hyperlipidemia-continue with a low-fat diet as well as exercise as tolerated. LDL goal at least of less than 130 given her cardiac history. Most recent value was 161. Strict adherence to diet with consideration for statin therapy. Patient defers adding statin therapy at present. E78.2 272.2  2. PAT (paroxysmal atrial tachycardia) (CMS-HCC)-no breakthrough on metoprolol. Will continue this regimen for now as her rate appears to be in good control. I47.1 427.0  3. Chest pain syndrome, unspecified-no evidence of ischemia on stress echo. Pain may be musculoskeletal or pulmonary in etiology. Chest CT angiogram revealed no coronary disease . Nodules are stable however she was noted to have a hiatal hernia and is now being evaluated by general surgery and GI for this. R07.9 786.50   4. Shortness of breath-PFT suggested bronchospasm. Will continue with Advair daily and continue to use her rescue inhaler as needed.   Return in about 6 months (around 12/20/2019).  These notes generated with voice recognition software. I apologize for typographical errors.  Kimberly Levans, MD    Electronically signed by Kimberly Levans, MD at  06/20/2019 4:15 PM EDT   Plan of Treatment - documented as of this encounter Upcoming Encounters Upcoming Encounters  Date Type Specialty Care Team Description  11/04/2019 Ancillary Orders Lab Yevonne Pax, MD  [place]  [facility] [place]  [place], [place] [address]  [phone]  [phone] (Fax)    11/11/2019 Office Visit Internal Medicine Yevonne Pax, MD  [place]  [facility]  [place], [place] [address]  [phone]  [phone]    11/14/2019 Office Visit Pulmonary Ottie Glazier, MD  [place], [place] [address]  [phone]  [phone] ([phone]    12/19/2019 Office Visit Cardiology Fath, Aloha Gell, MD  [address]  [place], [place] [address]  [phone]  [phone] (Fax)     Goals - documented as of this encounter Goal Patient Goal Type Associated Problems Recent Progress Patient-Stated? Author  Lose Weight  Weight  On track (10/27/2018 3:46 PM EST) Yes Bernarda Caffey, CMA  Note:   Patient would like to lose 40 pounds by reducing her friend food intake as  well as eating more at home.    Visit Diagnoses - documented in this encounter Diagnosis  PAT (paroxysmal atrial tachycardia) (CMS-HCC) - Primary  Paroxysmal supraventricular tachycardia   Hemangioma of liver  Hemangioma of other sites   RAD (reactive airway disease), moderate persistent, uncomplicated   Gastroesophageal reflux disease without esophagitis  Esophageal reflux   Combined hyperlipidemia  Other and unspecified hyperlipidemia   Images  Patient Contacts  Contact Name Contact Address Communication Relationship to Patient  Kurtis Bushman Unknown [phone] [facility]) Son or Daughter, Emergency Contact  Document Information  Primary Care Provider Other Service Providers Document Coverage Dates  Yevonne Pax, MD (Apr. 22, 2015April 22, 2015 -  Present) DM: [phone] (Work) [phone] (Fax) [place] [facility] [place], [place] [address] Internal Medicine [facility] [address] [place], [place] [address]  Jun. 29, 2020June 29, [place] [address] [place], [place] [address]   Encounter Providers Encounter Date  Kimberly Levans, MD (Attending) DM: [phone] (Work) [phone] (Fax) [id number] [place], [place] [address] Cardiovascular Disease Jun. 29, 2020June 29, 2020

## 2019-07-18 NOTE — Patient Instructions (Signed)
Your procedure is scheduled on: 07-21-19 THURSDAY Report to Same Day Surgery 2nd floor medical mall [facility] Entrance-take elevator on left to 2nd floor.  Check in with surgery information desk.) To find out your arrival time please call [phone] between 1PM - 3PM on 07-20-19 [facility]  Remember: Instructions that are not followed completely may result in serious medical risk, up to and including death, or upon the discretion of your surgeon and anesthesiologist your surgery may need to be rescheduled.    _x___ 1. Do not eat food after midnight the night before your procedure. NO GUM OR CANDY AFTER MIDNIGHT. You may drink clear liquids up to 2 hours before you are scheduled to arrive at the hospital for your procedure.  Do not drink clear liquids within 2 hours of your scheduled arrival to the hospital.  Clear liquids include  --Water or Apple juice without pulp  --Clear carbohydrate beverage such as ClearFast or Gatorade  --Black Coffee or Clear Tea (No milk, no creamers, do not add anything to the coffee or Tea   ____Ensure clear carbohydrate drink on the way to the hospital for bariatric patients  ____Ensure clear carbohydrate drink 3 hours before surgery.      __x__ 2. No Alcohol for 24 hours before or after surgery.   __x__3. No Smoking or e-cigarettes for 24 prior to surgery.  Do not use any chewable tobacco products for at least 6 hour prior to surgery   ____  4. Bring all medications with you on the day of surgery if instructed.    __x__ 5. Notify your doctor if there is any change in your medical condition     (cold, fever, infections).    x___6. On the morning of surgery brush your teeth with toothpaste and water.  You may rinse your mouth with mouth wash if you wish.  Do not swallow any toothpaste or mouthwash.   Do not wear jewelry, make-up, hairpins, clips or nail polish.  Do not wear lotions, powders, or perfumes. You may wear deodorant.  Do not shave 48 hours  prior to surgery. Men may shave face and neck.  Do not bring valuables to the hospital.    [facility] is not responsible for any belongings or valuables.               Contacts, dentures or bridgework may not be worn into surgery.  Leave your suitcase in the car. After surgery it may be brought to your room.  For patients admitted to the hospital, discharge time is determined by your treatment team.  _  Patients discharged the day of surgery will not be allowed to drive home.  You will need someone to drive you home and stay with you the night of your procedure.    Please read over the following fact sheets that you were given:   [facility] Preparing for Surgery   _x___ TAKE THE FOLLOWING MEDICATION THE MORNING OF SURGERY WITH A SMALL SIP OF WATER. These include:  1. CLARITIN (LORATIDINE)  2. MAGNESIUM  3. METOPROLOL (TOPROL)  4. OXYBUTYNIN (DITROPAN)  5. PRILOSEC (OMEPRAZOLE)  6. TAKE AN EXTRA PRILOSEC THE NIGHT BEFORE YOUR SURGERY  ____Fleets enema or Magnesium Citrate as directed.   _x___ Use CHG Soap or sage wipes as directed on instruction sheet   _X___ Use inhalers on the day of surgery and bring to hospital day of surgery-USE YOUR ADVAIR INHALER AND ALBUTEROL INHALER AM OF SURGERY AND BRING ALBUTEROL INHALER TO  HOPSITAL  ____ Stop Metformin and Janumet 2 days prior to surgery.    ____ Take 1/2 of usual insulin dose the night before surgery and none on the morning surgery.   _X___ Follow recommendations from Cardiologist, Pulmonologist or PCP regarding stopping Aspirin, Coumadin, Plavix ,Eliquis, Effient, or Pradaxa, and Pletal-ASK DR PABON REGARDING STOPPING YOUR ASPIRIN  X____Stop Anti-inflammatories such as Advil, Aleve, Ibuprofen, Motrin, Naproxen, Naprosyn, Goodies powders or aspirin products NOW-OK to take Tylenol    _x___ Stop supplements until after surgery-STOP VITAMIN E NOW-MAY RESUME AFTER SURGERY   ____ Bring C-Pap to the hospital.

## 2019-07-20 ENCOUNTER — Encounter: Payer: Self-pay | Admitting: *Deleted

## 2019-07-20 NOTE — Pre-Procedure Instructions (Signed)
Pulmonary clearance on chart-low risk

## 2019-07-20 NOTE — Pre-Procedure Instructions (Signed)
ECG 12-lead3/13/2020 [place] Component Name Value Ref Range  Vent Rate (bpm) 56   PR Interval (msec) 172   QRS Interval (msec) 86   QT Interval (msec) 456   QTc (msec) 440   Other Result Information  This result has an attachment that is not available.  Result Narrative  Sinus bradycardia Left axis deviation Abnormal ECG When compared with ECG of 19-Oct-2018 15:56, No significant change was found I reviewed and concur with this report. Electronically signed [id number] MD, [place] [phone]) on 04/08/2019 1:07:56 PM  Status Results Details   Encounter Summary

## 2019-07-21 ENCOUNTER — Encounter: Payer: Self-pay | Admitting: *Deleted

## 2019-07-21 ENCOUNTER — Inpatient Hospital Stay
Admission: RE | Admit: 2019-07-21 | Discharge: 2019-07-22 | DRG: 328 | Disposition: A | Discharge status: Home / Self Care | Payer: Medicare Other | Attending: Surgery | Admitting: Surgery

## 2019-07-21 ENCOUNTER — Observation Stay: Payer: Medicare Other

## 2019-07-21 ENCOUNTER — Inpatient Hospital Stay: Payer: Medicare Other | Admitting: Anesthesiology

## 2019-07-21 ENCOUNTER — Other Ambulatory Visit: Payer: Self-pay

## 2019-07-21 ENCOUNTER — Encounter
Admission: RE | Disposition: A | Discharge status: Home / Self Care | Payer: Self-pay | Source: Home / Self Care | Attending: Surgery

## 2019-07-21 DIAGNOSIS — Z8719 Personal history of other diseases of the digestive system: Secondary | ICD-10-CM

## 2019-07-21 DIAGNOSIS — Z87891 Personal history of nicotine dependence: Secondary | ICD-10-CM | POA: Diagnosis not present

## 2019-07-21 DIAGNOSIS — Z8249 Family history of ischemic heart disease and other diseases of the circulatory system: Secondary | ICD-10-CM | POA: Diagnosis not present

## 2019-07-21 DIAGNOSIS — K449 Diaphragmatic hernia without obstruction or gangrene: Principal | ICD-10-CM

## 2019-07-21 DIAGNOSIS — K219 Gastro-esophageal reflux disease without esophagitis: Secondary | ICD-10-CM | POA: Diagnosis present

## 2019-07-21 DIAGNOSIS — Z9889 Other specified postprocedural states: Secondary | ICD-10-CM

## 2019-07-21 DIAGNOSIS — J449 Chronic obstructive pulmonary disease, unspecified: Secondary | ICD-10-CM | POA: Diagnosis present

## 2019-07-21 DIAGNOSIS — Z20828 Contact with and (suspected) exposure to other viral communicable diseases: Secondary | ICD-10-CM | POA: Diagnosis present

## 2019-07-21 DIAGNOSIS — Z823 Family history of stroke: Secondary | ICD-10-CM | POA: Diagnosis not present

## 2019-07-21 HISTORY — DX: Personal history of other diseases of the digestive system: Z87.19

## 2019-07-21 HISTORY — DX: Dyspnea, unspecified: R06.00

## 2019-07-21 LAB — CBC
HCT: 39.7 % (ref 36.0–46.0)
Hemoglobin: 12.6 g/dL (ref 12.0–15.0)
MCH: 28.3 pg (ref 26.0–34.0)
MCHC: 31.7 g/dL (ref 30.0–36.0)
MCV: 89 fL (ref 80.0–100.0)
Platelets: 227 10*3/uL (ref 150–400)
RBC: 4.46 MIL/uL (ref 3.87–5.11)
RDW: 12.7 % (ref 11.5–15.5)
WBC: 12.1 10*3/uL — ABNORMAL HIGH (ref 4.0–10.5)
nRBC: 0 % (ref 0.0–0.2)

## 2019-07-21 LAB — CREATININE, SERUM
Creatinine, Ser: 0.7 mg/dL (ref 0.44–1.00)
GFR calc Af Amer: >60 mL/min (ref >60)
GFR calc non Af Amer: >60 mL/min (ref >60)

## 2019-07-21 SURGERY — FUNDOPLICATION, NISSEN, ROBOT-ASSISTED, LAPAROSCOPIC
Anesthesia: General

## 2019-07-21 MED ORDER — ACETAMINOPHEN 500 MG PO TABS
ORAL_TABLET | ORAL | Status: AC
  Administered 2019-07-21: 06:00:00 1000 mg via ORAL
  Filled 2019-07-21: qty 2

## 2019-07-21 MED ORDER — HYDROCODONE-ACETAMINOPHEN 7.5-325 MG PO TABS
1.0000 | ORAL_TABLET | Freq: Once | ORAL | Status: DC | PRN
  Filled 2019-07-21: qty 1

## 2019-07-21 MED ORDER — MORPHINE SULFATE (PF) 2 MG/ML IV SOLN: 2.0000 mg | INTRAVENOUS | Status: DC | PRN

## 2019-07-21 MED ORDER — ACETAMINOPHEN 325 MG PO TABS: 325.0000 mg | ORAL_TABLET | ORAL | Status: DC | PRN

## 2019-07-21 MED ORDER — SODIUM CHLORIDE 7 % IN NEBU: 4.0000 mL | INHALATION_SOLUTION | Freq: Every evening | RESPIRATORY_TRACT | Status: DC

## 2019-07-21 MED ORDER — LACTATED RINGERS IV SOLN
INTRAVENOUS | Status: DC
  Administered 2019-07-21: 07:00:00 via INTRAVENOUS

## 2019-07-21 MED ORDER — FENTANYL CITRATE (PF) 250 MCG/5ML IJ SOLN
INTRAMUSCULAR | Status: AC
  Filled 2019-07-21: qty 5

## 2019-07-21 MED ORDER — BUPIVACAINE LIPOSOME 1.3 % IJ SUSP
INTRAMUSCULAR | Status: DC | PRN
  Administered 2019-07-21: 20 mL

## 2019-07-21 MED ORDER — PROPOFOL 10 MG/ML IV BOLUS
INTRAVENOUS | Status: DC | PRN
  Administered 2019-07-21: 120 mg via INTRAVENOUS

## 2019-07-21 MED ORDER — DIPHENHYDRAMINE HCL 12.5 MG/5ML PO ELIX
12.5000 mg | ORAL_SOLUTION | Freq: Four times a day (QID) | ORAL | Status: DC | PRN
  Filled 2019-07-21: qty 5

## 2019-07-21 MED ORDER — ENOXAPARIN SODIUM 40 MG/0.4ML ~~LOC~~ SOLN
40.0000 mg | SUBCUTANEOUS | Status: DC
  Administered 2019-07-22: 09:00:00 40 mg via SUBCUTANEOUS
  Filled 2019-07-21: qty 0.4

## 2019-07-21 MED ORDER — MOMETASONE FURO-FORMOTEROL FUM 200-5 MCG/ACT IN AERO
2.0000 | INHALATION_SPRAY | Freq: Two times a day (BID) | RESPIRATORY_TRACT | Status: DC
  Administered 2019-07-21 – 2019-07-22 (×2): 2 via RESPIRATORY_TRACT
  Filled 2019-07-21: qty 8.8

## 2019-07-21 MED ORDER — PROMETHAZINE HCL 25 MG/ML IJ SOLN: 6.2500 mg | INTRAMUSCULAR | Status: DC | PRN

## 2019-07-21 MED ORDER — ALBUTEROL SULFATE (2.5 MG/3ML) 0.083% IN NEBU: 3.0000 mL | INHALATION_SOLUTION | Freq: Four times a day (QID) | RESPIRATORY_TRACT | Status: DC | PRN

## 2019-07-21 MED ORDER — DEXAMETHASONE SODIUM PHOSPHATE 10 MG/ML IJ SOLN
INTRAMUSCULAR | Status: DC | PRN
  Administered 2019-07-21: 10 mg via INTRAVENOUS

## 2019-07-21 MED ORDER — ROCURONIUM BROMIDE 50 MG/5ML IV SOLN
INTRAVENOUS | Status: AC
  Filled 2019-07-21: qty 1

## 2019-07-21 MED ORDER — ROCURONIUM BROMIDE 100 MG/10ML IV SOLN
INTRAVENOUS | Status: DC | PRN
  Administered 2019-07-21 (×2): 20 mg via INTRAVENOUS
  Administered 2019-07-21: 50 mg via INTRAVENOUS

## 2019-07-21 MED ORDER — DEXAMETHASONE SODIUM PHOSPHATE 10 MG/ML IJ SOLN
INTRAMUSCULAR | Status: AC
  Filled 2019-07-21: qty 1

## 2019-07-21 MED ORDER — CEFAZOLIN SODIUM-DEXTROSE 2-4 GM/100ML-% IV SOLN
2.0000 g | INTRAVENOUS | Status: AC
  Administered 2019-07-21: 08:00:00 2 g via INTRAVENOUS

## 2019-07-21 MED ORDER — SODIUM CHLORIDE 0.9 % IV SOLN
INTRAVENOUS | Status: DC
  Administered 2019-07-21 – 2019-07-22 (×2): via INTRAVENOUS

## 2019-07-21 MED ORDER — DIPHENHYDRAMINE HCL 50 MG/ML IJ SOLN: 12.5000 mg | Freq: Four times a day (QID) | INTRAMUSCULAR | Status: DC | PRN

## 2019-07-21 MED ORDER — GABAPENTIN 300 MG PO CAPS
300.0000 mg | ORAL_CAPSULE | ORAL | Status: AC
  Administered 2019-07-21: 06:00:00 300 mg via ORAL

## 2019-07-21 MED ORDER — CEFAZOLIN SODIUM-DEXTROSE 2-4 GM/100ML-% IV SOLN
INTRAVENOUS | Status: AC
  Filled 2019-07-21: qty 100

## 2019-07-21 MED ORDER — LORATADINE 10 MG PO TABS
10.0000 mg | ORAL_TABLET | ORAL | Status: DC
  Administered 2019-07-22: 10 mg via ORAL
  Filled 2019-07-21: qty 1

## 2019-07-21 MED ORDER — METOPROLOL SUCCINATE ER 50 MG PO TB24
50.0000 mg | ORAL_TABLET | Freq: Two times a day (BID) | ORAL | Status: DC
  Administered 2019-07-21 – 2019-07-22 (×2): 50 mg via ORAL
  Filled 2019-07-21 (×2): qty 1

## 2019-07-21 MED ORDER — CHLORHEXIDINE GLUCONATE CLOTH 2 % EX PADS: 6.0000 | MEDICATED_PAD | Freq: Once | CUTANEOUS | Status: DC

## 2019-07-21 MED ORDER — LACTATED RINGERS IV SOLN
INTRAVENOUS | Status: DC | PRN
  Administered 2019-07-21: 08:00:00 via INTRAVENOUS

## 2019-07-21 MED ORDER — MEPERIDINE HCL 50 MG/ML IJ SOLN: 6.2500 mg | INTRAMUSCULAR | Status: DC | PRN

## 2019-07-21 MED ORDER — ONDANSETRON HCL 4 MG/2ML IJ SOLN
INTRAMUSCULAR | Status: DC | PRN
  Administered 2019-07-21: 4 mg via INTRAVENOUS

## 2019-07-21 MED ORDER — SUGAMMADEX SODIUM 200 MG/2ML IV SOLN
INTRAVENOUS | Status: AC
  Filled 2019-07-21: qty 2

## 2019-07-21 MED ORDER — HYDRALAZINE HCL 20 MG/ML IJ SOLN: 10.0000 mg | INTRAMUSCULAR | Status: DC | PRN

## 2019-07-21 MED ORDER — EPHEDRINE SULFATE 50 MG/ML IJ SOLN
INTRAMUSCULAR | Status: DC | PRN
  Administered 2019-07-21: 10 mg via INTRAVENOUS
  Administered 2019-07-21: 5 mg via INTRAVENOUS
  Administered 2019-07-21: 15 mg via INTRAVENOUS

## 2019-07-21 MED ORDER — LIDOCAINE HCL (CARDIAC) PF 100 MG/5ML IV SOSY
PREFILLED_SYRINGE | INTRAVENOUS | Status: DC | PRN
  Administered 2019-07-21: 80 mg via INTRAVENOUS

## 2019-07-21 MED ORDER — PROCHLORPERAZINE EDISYLATE 10 MG/2ML IJ SOLN
5.0000 mg | Freq: Four times a day (QID) | INTRAMUSCULAR | Status: DC | PRN
  Filled 2019-07-21: qty 2

## 2019-07-21 MED ORDER — HEPARIN SODIUM (PORCINE) 5000 UNIT/ML IJ SOLN
INTRAMUSCULAR | Status: AC
  Administered 2019-07-21: 06:00:00 5000 [IU] via SUBCUTANEOUS
  Filled 2019-07-21: qty 1

## 2019-07-21 MED ORDER — PROCHLORPERAZINE MALEATE 10 MG PO TABS
10.0000 mg | ORAL_TABLET | Freq: Four times a day (QID) | ORAL | Status: DC | PRN
  Filled 2019-07-21: qty 1

## 2019-07-21 MED ORDER — BUPIVACAINE LIPOSOME 1.3 % IJ SUSP: 20.0000 mL | Freq: Once | INTRAMUSCULAR | Status: DC

## 2019-07-21 MED ORDER — ACETAMINOPHEN 160 MG/5ML PO SOLN
325.0000 mg | ORAL | Status: DC | PRN
  Filled 2019-07-21: qty 20.3

## 2019-07-21 MED ORDER — EPHEDRINE SULFATE 50 MG/ML IJ SOLN
INTRAMUSCULAR | Status: AC
  Filled 2019-07-21: qty 1

## 2019-07-21 MED ORDER — ACETAMINOPHEN 500 MG PO TABS
1000.0000 mg | ORAL_TABLET | Freq: Four times a day (QID) | ORAL | Status: DC
  Administered 2019-07-21 – 2019-07-22 (×4): 1000 mg via ORAL
  Filled 2019-07-21 (×5): qty 2

## 2019-07-21 MED ORDER — ONDANSETRON HCL 4 MG/2ML IJ SOLN
4.0000 mg | Freq: Four times a day (QID) | INTRAMUSCULAR | Status: DC | PRN
  Administered 2019-07-21: 21:00:00 4 mg via INTRAVENOUS
  Filled 2019-07-21: qty 2

## 2019-07-21 MED ORDER — BUPIVACAINE HCL (PF) 0.25 % IJ SOLN
INTRAMUSCULAR | Status: DC | PRN
  Administered 2019-07-21: 30 mL

## 2019-07-21 MED ORDER — LIDOCAINE HCL 4 % MT SOLN
OROMUCOSAL | Status: DC | PRN
  Administered 2019-07-21: 4 mL via TOPICAL

## 2019-07-21 MED ORDER — ACETAMINOPHEN 500 MG PO TABS
1000.0000 mg | ORAL_TABLET | ORAL | Status: AC
  Administered 2019-07-21: 06:00:00 1000 mg via ORAL

## 2019-07-21 MED ORDER — FENTANYL CITRATE (PF) 100 MCG/2ML IJ SOLN
INTRAMUSCULAR | Status: DC | PRN
  Administered 2019-07-21: 100 ug via INTRAVENOUS
  Administered 2019-07-21 (×3): 50 ug via INTRAVENOUS

## 2019-07-21 MED ORDER — SUGAMMADEX SODIUM 200 MG/2ML IV SOLN
INTRAVENOUS | Status: DC | PRN
  Administered 2019-07-21: 175 mg via INTRAVENOUS

## 2019-07-21 MED ORDER — OXYBUTYNIN CHLORIDE 5 MG PO TABS
5.0000 mg | ORAL_TABLET | Freq: Two times a day (BID) | ORAL | Status: DC
  Administered 2019-07-21 – 2019-07-22 (×2): 5 mg via ORAL
  Filled 2019-07-21 (×2): qty 1

## 2019-07-21 MED ORDER — BUPIVACAINE-EPINEPHRINE (PF) 0.25% -1:200000 IJ SOLN
INTRAMUSCULAR | Status: AC
  Filled 2019-07-21: qty 30

## 2019-07-21 MED ORDER — LIDOCAINE HCL (PF) 2 % IJ SOLN
INTRAMUSCULAR | Status: AC
  Filled 2019-07-21: qty 10

## 2019-07-21 MED ORDER — FENTANYL CITRATE (PF) 100 MCG/2ML IJ SOLN
25.0000 ug | INTRAMUSCULAR | Status: DC | PRN
  Administered 2019-07-21 (×2): 25 ug via INTRAVENOUS

## 2019-07-21 MED ORDER — GABAPENTIN 300 MG PO CAPS
ORAL_CAPSULE | ORAL | Status: AC
  Administered 2019-07-21: 300 mg via ORAL
  Filled 2019-07-21: qty 1

## 2019-07-21 MED ORDER — PROPOFOL 10 MG/ML IV BOLUS
INTRAVENOUS | Status: AC
  Filled 2019-07-21: qty 20

## 2019-07-21 MED ORDER — BUPIVACAINE HCL (PF) 0.25 % IJ SOLN
INTRAMUSCULAR | Status: AC
  Filled 2019-07-21: qty 30

## 2019-07-21 MED ORDER — HEPARIN SODIUM (PORCINE) 5000 UNIT/ML IJ SOLN
5000.0000 [IU] | Freq: Once | INTRAMUSCULAR | Status: AC
  Administered 2019-07-21: 06:00:00 5000 [IU] via SUBCUTANEOUS

## 2019-07-21 MED ORDER — MOMETASONE FURO-FORMOTEROL FUM 200-5 MCG/ACT IN AERO: 2.0000 | INHALATION_SPRAY | Freq: Two times a day (BID) | RESPIRATORY_TRACT | Status: DC

## 2019-07-21 MED ORDER — BUPIVACAINE LIPOSOME 1.3 % IJ SUSP
INTRAMUSCULAR | Status: AC
  Filled 2019-07-21: qty 20

## 2019-07-21 MED ORDER — ONDANSETRON 4 MG PO TBDP: 4.0000 mg | ORAL_TABLET | Freq: Four times a day (QID) | ORAL | Status: DC | PRN

## 2019-07-21 MED ORDER — OXYCODONE HCL 5 MG PO TABS: 5.0000 mg | ORAL_TABLET | ORAL | Status: DC | PRN

## 2019-07-21 MED ORDER — ONDANSETRON HCL 4 MG/2ML IJ SOLN
INTRAMUSCULAR | Status: AC
  Filled 2019-07-21: qty 2

## 2019-07-21 MED ORDER — FENTANYL CITRATE (PF) 100 MCG/2ML IJ SOLN
INTRAMUSCULAR | Status: AC
  Filled 2019-07-21: qty 2

## 2019-07-21 SURGICAL SUPPLY — 48 items
CANISTER SUCT 1200ML W/VALVE (MISCELLANEOUS) ×2 IMPLANT
CHLORAPREP W/TINT 26 (MISCELLANEOUS) ×2 IMPLANT
COVER WAND RF STERILE (DRAPES) ×2 IMPLANT
DECANTER SPIKE VIAL GLASS SM (MISCELLANEOUS) ×1 IMPLANT
DEFOGGER SCOPE WARMER CLEARIFY (MISCELLANEOUS) ×2 IMPLANT
DERMABOND ADVANCED (GAUZE/BANDAGES/DRESSINGS) ×1
DERMABOND ADVANCED .7 DNX12 (GAUZE/BANDAGES/DRESSINGS) ×1 IMPLANT
DRAPE 3/4 80X56 (DRAPES) ×2 IMPLANT
DRAPE ARM DVNC X/XI (DISPOSABLE) ×4 IMPLANT
DRAPE COLUMN DVNC XI (DISPOSABLE) ×1 IMPLANT
DRAPE DA VINCI XI ARM (DISPOSABLE) ×4
DRAPE DA VINCI XI COLUMN (DISPOSABLE) ×1
ELECT CAUTERY BLADE 6.4 (BLADE) ×2 IMPLANT
ELECT REM PT RETURN 9FT ADLT (ELECTROSURGICAL) ×2
ELECTRODE REM PT RTRN 9FT ADLT (ELECTROSURGICAL) ×1 IMPLANT
GLOVE BIO SURGEON STRL SZ7 (GLOVE) ×7 IMPLANT
GOWN STRL REUS W/ TWL LRG LVL3 (GOWN DISPOSABLE) ×4 IMPLANT
GOWN STRL REUS W/TWL LRG LVL3 (GOWN DISPOSABLE) ×4
IRRIGATION STRYKERFLOW (MISCELLANEOUS) IMPLANT
IRRIGATOR STRYKERFLOW (MISCELLANEOUS)
IV NS 1000ML (IV SOLUTION)
IV NS 1000ML BAXH (IV SOLUTION) IMPLANT
KIT PINK PAD W/HEAD ARE REST (MISCELLANEOUS) ×2
KIT PINK PAD W/HEAD ARM REST (MISCELLANEOUS) ×1 IMPLANT
LABEL OR SOLS (LABEL) ×2 IMPLANT
NEEDLE HYPO 22GX1.5 SAFETY (NEEDLE) ×2 IMPLANT
OBTURATOR OPTICAL STANDARD 8MM (TROCAR) ×1
OBTURATOR OPTICAL STND 8 DVNC (TROCAR) ×1
OBTURATOR OPTICALSTD 8 DVNC (TROCAR) ×1 IMPLANT
PACK LAP CHOLECYSTECTOMY (MISCELLANEOUS) ×2 IMPLANT
PENCIL ELECTRO HAND CTR (MISCELLANEOUS) ×2 IMPLANT
SEAL CANN UNIV 5-8 DVNC XI (MISCELLANEOUS) ×4 IMPLANT
SEAL XI 5MM-8MM UNIVERSAL (MISCELLANEOUS) ×4
SEALER VESSEL DA VINCI XI (MISCELLANEOUS) ×3
SEALER VESSEL EXT DVNC XI (MISCELLANEOUS) ×1 IMPLANT
SOLUTION ELECTROLUBE (MISCELLANEOUS) ×2 IMPLANT
SPONGE LAP 18X18 RF (DISPOSABLE) ×2 IMPLANT
SPONGE LAP 4X18 RFD (DISPOSABLE) ×1 IMPLANT
SUT MNCRL 4-0 (SUTURE) ×1
SUT MNCRL 4-0 27XMFL (SUTURE) ×1
SUT SILK 2 0 SH (SUTURE) ×4 IMPLANT
SUT VICRYL 0 AB UR-6 (SUTURE) ×4 IMPLANT
SUT VLOC 90 S/L VL9 GS22 (SUTURE) ×4 IMPLANT
SUTURE MNCRL 4-0 27XMF (SUTURE) ×1 IMPLANT
TRAY FOLEY SLVR 16FR LF STAT (SET/KITS/TRAYS/PACK) ×2 IMPLANT
TROCAR 130MM GELPORT  DAV (MISCELLANEOUS) ×2 IMPLANT
TROCAR XCEL NON-BLD 5MMX100MML (ENDOMECHANICALS) ×2 IMPLANT
TUBING EVAC SMOKE HEATED PNEUM (TUBING) ×2 IMPLANT

## 2019-07-21 NOTE — Anesthesia Preprocedure Evaluation (Addendum)
Anesthesia Evaluation  Patient identified by MRN, date of birth, ID band Patient awake    Reviewed: Allergy & Precautions, H&P , NPO status , reviewed documented beta blocker date and time   History of Anesthesia Complications (+) PONV, Family history of anesthesia reaction and history of anesthetic complications  Airway Mallampati: II  TM Distance: >3 FB Neck ROM: full    Dental  (+) Edentulous Upper, Edentulous Lower, Upper Dentures, Lower Dentures   Pulmonary COPD, former smoker,    Pulmonary exam normal        Cardiovascular Normal cardiovascular exam+ dysrhythmias      Neuro/Psych  Neuromuscular disease    GI/Hepatic hiatal hernia, GERD  ,  Endo/Other    Renal/GU      Musculoskeletal  (+) Arthritis ,   Abdominal   Peds  Hematology  (+) Blood dyscrasia, anemia ,   Anesthesia Other Findings Past Medical History: No date: A-fib ([place]) 05/2019: Abnormal chest x-ray with multiple lung nodules No date: Arthritis     Comment:  left foot No date: Carpal tunnel syndrome No date: Complication of anesthesia No date: COPD (chronic obstructive pulmonary disease) (HCC) No date: Dyspnea     Comment:  WITH EXERTION No date: Dysrhythmia     Comment:  atrial fibrillation No date: Family history of adverse reaction to anesthesia     Comment:  pt unsure-mom died at a young age and unsure about dad No date: Fibrocystic breast disease in female No date: GERD (gastroesophageal reflux disease) No date: Hemangioma of liver No date: History of hiatal hernia No date: Pinched nerve in shoulder, left     Comment:  LEFT 1958: PONV (postoperative nausea and vomiting)     Comment:  had eye surgery 5th grade and she got sick  No date: Urinary incontinence No date: Vertigo Past Surgical History: No date: ABDOMINAL HYSTERECTOMY 01/24/2008: BLADDER SUSPENSION 12/2004: BREAST BIOPSY; Left     Comment:  core- neg 06/14/2013:  CATARACT EXTRACTION; Left 06/28/2013: CATARACT EXTRACTION; Right 08/2008: CHOLECYSTECTOMY No date: COLONOSCOPY No date: DIAGNOSTIC LAPAROSCOPY 06/03/2019: ESOPHAGOGASTRODUODENOSCOPY (EGD) WITH PROPOFOL; N/A     Comment:  Procedure: ESOPHAGOGASTRODUODENOSCOPY (EGD) WITH               PROPOFOL;  Surgeon: Lollie Sails, MD;  Location:               [facility] ENDOSCOPY;  Service: Endoscopy;  Laterality: N/A; 1958: EYE SURGERY; Right 09/07/2014: FOOT SURGERY; Right     Comment:  sciatic nerve damaged during injection right before               surgery. Foot still partially numb.  PINS 11/1977: HEMORROIDECTOMY 01/24/2008: HERNIA REPAIR No date: KNEE ARTHROSCOPY; Right 12/1997: KNEE SURGERY; Left 11/2005: LARYNGOSCOPY No date: reactive airway disease No date: seasonal allergies No date: TUBAL LIGATION 1977: TYMPANOSTOMY TUBE PLACEMENT 04/1986: VAGINAL HYSTERECTOMY BMI    Body Mass Index: 31.11 kg/m     Reproductive/Obstetrics                            Anesthesia Physical Anesthesia Plan  ASA: III  Anesthesia Plan: General ETT   Post-op Pain Management:    Induction: Intravenous  PONV Risk Score and Plan: 4 or greater and Ondansetron, Dexamethasone, Midazolam and Treatment may vary due to age or medical condition  Airway Management Planned: Oral ETT  Additional Equipment:   Intra-op Plan:   Post-operative Plan: Extubation in OR  Informed  Consent: I have reviewed the patients History and Physical, chart, labs and discussed the procedure including the risks, benefits and alternatives for the proposed anesthesia with the patient or authorized representative who has indicated his/her understanding and acceptance.     Dental Advisory Given  Plan Discussed with: CRNA  Anesthesia Plan Comments:       Anesthesia Quick Evaluation

## 2019-07-21 NOTE — Progress Notes (Signed)
Pt c/o left shoulder pain. Dr. Lavone Neri called and notified. Acknowledged. Orders received.

## 2019-07-21 NOTE — Anesthesia Procedure Notes (Signed)
Procedure Name: Intubation Date/Time: 07/21/2019 7:45 AM Performed by: Eben Burow, CRNA Pre-anesthesia Checklist: Patient identified, Emergency Drugs available, Suction available and Patient being monitored Patient Re-evaluated:Patient Re-evaluated prior to induction Oxygen Delivery Method: Circle system utilized Preoxygenation: Pre-oxygenation with 100% oxygen Induction Type: IV induction Ventilation: Mask ventilation without difficulty Laryngoscope Size: Miller and 2 Grade View: Grade I Tube type: Oral Tube size: 7.0 mm Number of attempts: 1 Airway Equipment and Method: LTA kit utilized Placement Confirmation: ETT inserted through vocal cords under direct vision,  positive ETCO2 and breath sounds checked- equal and bilateral Secured at: 20 cm Tube secured with: Tape Dental Injury: Teeth and Oropharynx as per pre-operative assessment

## 2019-07-21 NOTE — H&P (Addendum)
HPI: 73 year old female known to me for a symptomatic hiatal hernia and significant reflux.  She does have a history of COPD with multiple inhalers she has been found to have microaspiration's that is worsening her respiratory status.  He recently underwent an EGD that I have personally reviewed showing HH, swallow reviewed personally as well, showing HH with reflux, no evidence of esophageal stricture or pathology. Reviewed with her and she is not on Xarelto anymore        Past Medical History:  Diagnosis Date  . A-fib ([place])   . Carpal tunnel syndrome   . COPD (chronic obstructive pulmonary disease) ([place])   . Dysrhythmia    atrial fibrillation  . Fibrocystic breast disease in female   . Hemangioma of liver   . Hypertension   . Urinary incontinence   . Vertigo          Past Surgical History:  Procedure Laterality Date  . BLADDER SUSPENSION  01/24/2008  . BREAST BIOPSY Left 12/2004   core- neg  . CATARACT EXTRACTION Left 06/14/2013  . CATARACT EXTRACTION Right 06/28/2013  . CHOLECYSTECTOMY  08/2008  . COLONOSCOPY    . ESOPHAGOGASTRODUODENOSCOPY (EGD) WITH PROPOFOL N/A 06/03/2019   Procedure: ESOPHAGOGASTRODUODENOSCOPY (EGD) WITH PROPOFOL;  Surgeon: Lollie Sails, MD;  Location: [facility] ENDOSCOPY;  Service: Endoscopy;  Laterality: N/A;  . EYE SURGERY Right 1958  . FOOT SURGERY Right 09/07/14  . HEMORROIDECTOMY  11/1977  . HERNIA REPAIR  01/24/2008  . KNEE SURGERY Left 12/1997  . LARYNGOSCOPY  11/2005  . reactive airway disease    . seasonal allergies    . TYMPANOSTOMY TUBE PLACEMENT  1977  . VAGINAL HYSTERECTOMY  04/1986         Family History  Problem Relation Age of Onset  . Hypertension Mother   . Stroke Mother   . Leukemia Father   . Cancer Other   . Breast cancer Paternal Aunt   . Breast cancer Cousin     Social History:  reports that she quit smoking about 19 years ago. She quit after 45.00 years of use. She has never  used smokeless tobacco. She reports that she does not drink alcohol or use drugs.  Allergies:       Allergies  Allergen Reactions  . Naproxen Other (See Comments)    passed out  . Propoxyphene Other (See Comments)  . Pseudoephedrine Hcl Palpitations  . Pseudoephedrine Hcl Palpitations    Medications reviewed.    ROS Full ROS performed and is otherwise negative other than what is stated in HPI   BP (!) 165/86   Pulse 60   Temp 97.7 F (36.5 C) (Skin)   Ht 5\' 5"  (1.651 m)   Wt 186 lb (84.4 kg)   SpO2 98%   BMI 30.95 kg/m   Physical Exam Vitals signs and nursing note reviewed. Exam conducted with a chaperone present.  Constitutional:      General: She is not in acute distress.    Appearance: Normal appearance.  Eyes:     General: No scleral icterus.       Right eye: No discharge.        Left eye: No discharge.  Neck:     Musculoskeletal: Normal range of motion and neck supple. No neck rigidity or muscular tenderness.  Cardiovascular:     Rate and Rhythm: Normal rate and regular rhythm.     Heart sounds: No murmur.  Pulmonary:     Effort: Pulmonary effort is normal.  No respiratory distress.     Breath sounds: No stridor.  Abdominal:     General: Abdomen is flat. There is no distension.     Palpations: There is no mass.     Tenderness: There is no abdominal tenderness. There is no guarding or rebound.     Hernia: No hernia is present.  Musculoskeletal: Normal range of motion.        General: No swelling.  Skin:    General: Skin is warm and dry.     Capillary Refill: Capillary refill takes less than 2 seconds.  Neurological:     General: No focal deficit present.     Mental Status: She is alert and oriented to person, place, and time.  Psychiatric:        Mood and Affect: Mood normal.        Behavior: Behavior normal.        Thought Content: Thought content normal.        Judgment: Judgment normal.      Assessment/Plan:  73 year old  female with moderate hiatal hernia with significant reflux symptoms and microaspiration.  Extensive discussion with the patient regarding the rationale for hiatal hernia repair and fundoplication.  I believe that she does have an indication for surgery.  Procedure discussed with the patient in detail.  Risk, benefits and possible complications including but not limited to: Bleeding, infection, esophageal perforation, recurrence and chronic pain.  She understands and wishes to proceed.    Caroleen Hamman, MD [facility] General Surgeon

## 2019-07-21 NOTE — Anesthesia Post-op Follow-up Note (Signed)
Anesthesia QCDR form completed.        

## 2019-07-21 NOTE — Progress Notes (Signed)
Patient wanting to try something different to eat other than clear liquids. Order received from Dr Dahlia Byes for full liquid

## 2019-07-21 NOTE — Anesthesia Postprocedure Evaluation (Signed)
Anesthesia Post Note  Patient: Kimberly Page  Procedure(s) Performed: XI ROBOTIC ASSISTED LAPAROSCOPIC NISSEN FUNDOPLICATION (N/A )  Patient location during evaluation: PACU Anesthesia Type: General Level of consciousness: awake and alert Pain management: pain level controlled Vital Signs Assessment: post-procedure vital signs reviewed and stable Respiratory status: spontaneous breathing, nonlabored ventilation, respiratory function stable and patient connected to nasal cannula oxygen Cardiovascular status: blood pressure returned to baseline and stable Postop Assessment: no apparent nausea or vomiting Anesthetic complications: no Comments: Pt c/o shoulder pain, EKG unchanged pt stable. Not felt to be cardiac     Last Vitals:  Vitals:   07/21/19 1228 07/21/19 1338  BP: 126/70 136/76  Pulse: 69 74  Resp: 16 20  Temp: 36.4 C (!) 36.3 C  SpO2: 97% 99%    Last Pain:  Vitals:   07/21/19 1338  TempSrc: Oral  PainSc:                  Alphonsus Sias

## 2019-07-21 NOTE — Op Note (Signed)
Robotic assisted laparoscopic Type III paraesophageal hernia repair with Nissen fundoplication   Pre-operative Diagnosis: GERD, hiatal hernia  Post-operative Diagnosis: same  Procedure:  Robotic assisted laparoscopic Nissen fundoplication w repair of  Type III paraesophageal  hernia  Surgeon: Caroleen Hamman, MD FACS  Assistant: Dr. Genevive Bi. Required due to the complexity of the case the need for exposure and lack of first assist.  Anesthesia: Gen. with endotracheal tube  Findings: Type III paraesophageal  hiatal hernia Loose wrap 360 degree over 56 FR Bougie   Estimated Blood Loss: 15cc       Specimens: Gallbladder           Complications: none   Procedure Details  The patient was seen again in the Holding Room. The benefits, complications, treatment options, and expected outcomes were discussed with the patient. The risks of bleeding, infection, recurrence of symptoms, failure to resolve symptoms,  esophageal damage, Dysphagia, bowel injury, any of which could require further surgery were reviewed with the patient. The likelihood of improving the patient's symptoms with return to their baseline status is good.  The patient and/or family concurred with the proposed plan, giving informed consent.  The patient was taken to Operating Room, identified  and the procedure verified.  A Time Out was held and the above information confirmed.  Prior to the induction of general anesthesia, antibiotic prophylaxis was administered. VTE prophylaxis was in place. General endotracheal anesthesia was then administered and tolerated well. After the induction, the abdomen was prepped with Chloraprep and draped in the sterile fashion. The patient was positioned in the supine position.  Cut down technique was used to enter the abdominal cavity and a Hasson trochar was placed after two vicryl stitches were anchored to the fascia. Pneumoperitoneum was then created with CO2 and tolerated well without any adverse  changes in the patient's vital signs.  Three 8-mm ports were placed under direct vision. All skin incisions  were infiltrated with a local anesthetic agent before making the incision and placing the trocars. An additional 5 mm regular laparoscopic port was placed to assist with retraction and exposure.   The patient was positioned  in reverse Trendelenburg, robot was brought to the surgical field and docked in the standard fashion.  We made sure all the instrumentation was kept indirect view at all times and that there were no collision between the arms. I scrubbed out and went to the console.  I used tip up instrument/arm to retract the liver, the vessel sealer on my right hand and a forced bipolar grasper on my left hand.  This along the extra 5 mm port allow me ample exposure and the ability to perform meticulous dissection  We Started dividing the lesser omentum via the pars flaccida. We encounter some bleeding that we controlled it with pressure and then vessel sealer.  We Were able to dissect the lesser curvature of the stomach and  dissected the fundus free from the right and left crus.  We circumferentially dissected the GE junction.  The hernia sac was also completely reduced and we were able to bring the stomach into the intra-abdominal position.  Attention then was turned to the greater curvature where the Fuchs gastrics were divided with sealer device.  We were able to identify the left crus and again were able to make sure there was a good circumferential dissection and that the hernia sac was completely excised.  We did perform also some dissection within the mediastinum to allow a complete reduction of  the sac and a to completely allow an intra-abdominal Nissen fundoplication. The GE junction was brought to the intra-abdominal cavity.  2-0V lock suture was inserted and the crus as well as the hernia was closed with a running suture  We Asked anesthesia to place a 56 French bougie and this  went easily.  We also observe trajectory of the bougie. 360 degree Nissen fundoplication was created with multiple 2-0 silk sutures and we placed 3 stitches taking some of the esophagus within that bite.  The fundoplication measured approximately 3-1/2 cm and he was floppy. I was very happy with the way the fundoplication laid and the repair of the hernia.  Is a small traction laceration within the liver that measure about 2 mm and we were able to cauterize it appropriately.   Inspection of the  upper quadrant was performed. No bleeding, bile  Or esophageal injuries leaks, or bowel injuries were noted. Robotic instruments and robotic arms were undocked in the standard fashion. All the needles were removed under direct visualization.   I scrubbed back in.  Pneumoperitoneum was released.  The periumbilical port site was closed with interrumpted 0 Vicryl sutures. 4-0 subcuticular Monocryl was used to close the skin. Liposomal marcaine was injected to all the incisions sites.  Dermabond was  applied.  The patient was then extubated and brought to the recovery room in stable condition. Sponge, lap, and needle counts were correct at closure and at the conclusion of the case.               Caroleen Hamman, MD, FACS

## 2019-07-21 NOTE — Progress Notes (Signed)
EKG preformed. Dr. Lavone Neri saw EKG reading. Stated that it looked good and that pt was ok to go to her room: 204

## 2019-07-21 NOTE — Transfer of Care (Signed)
Immediate Anesthesia Transfer of Care Note  Patient: Kimberly Page  Procedure(s) Performed: XI ROBOTIC ASSISTED LAPAROSCOPIC NISSEN FUNDOPLICATION (N/A )  Patient Location: PACU  Anesthesia Type:General  Level of Consciousness: drowsy  Airway & Oxygen Therapy: Patient Spontanous Breathing and Patient connected to face mask oxygen  Post-op Assessment: Report given to RN and Post -op Vital signs reviewed and stable  Post vital signs: Reviewed and stable  Last Vitals:  Vitals Value Taken Time  BP 99/64 07/21/19 1019  Temp 36.2 C 07/21/19 1019  Pulse 62 07/21/19 1025  Resp 9 07/21/19 1025  SpO2 98 % 07/21/19 1025  Vitals shown include unvalidated device data.  Last Pain:  Vitals:   07/21/19 1019  TempSrc:   PainSc: Asleep      Patients Stated Pain Goal: 1 (70/96/43 [id number])  Complications: No apparent anesthesia complications

## 2019-07-22 LAB — BASIC METABOLIC PANEL
Anion gap: 11 (ref 5–15)
BUN: 13 mg/dL (ref 8–23)
CO2: 23 mmol/L (ref 22–32)
Calcium: 8.6 mg/dL — ABNORMAL LOW (ref 8.9–10.3)
Chloride: 106 mmol/L (ref 98–111)
Creatinine, Ser: 0.57 mg/dL (ref 0.44–1.00)
GFR calc Af Amer: >60 mL/min (ref >60)
GFR calc non Af Amer: >60 mL/min (ref >60)
Glucose, Bld: 113 mg/dL — ABNORMAL HIGH (ref 70–99)
Potassium: 4.3 mmol/L (ref 3.5–5.1)
Sodium: 140 mmol/L (ref 135–145)

## 2019-07-22 LAB — CBC
HCT: 38 % (ref 36.0–46.0)
Hemoglobin: 12.3 g/dL (ref 12.0–15.0)
MCH: 28.3 pg (ref 26.0–34.0)
MCHC: 32.4 g/dL (ref 30.0–36.0)
MCV: 87.4 fL (ref 80.0–100.0)
Platelets: 232 10*3/uL (ref 150–400)
RBC: 4.35 MIL/uL (ref 3.87–5.11)
RDW: 12.5 % (ref 11.5–15.5)
WBC: 10.4 10*3/uL (ref 4.0–10.5)
nRBC: 0 % (ref 0.0–0.2)

## 2019-07-22 MED ORDER — OXYCODONE HCL 5 MG PO TABS: 5.0000 mg | ORAL_TABLET | ORAL | 0 refills | Status: DC | PRN

## 2019-07-22 NOTE — Discharge Summary (Signed)
[facility] SURGICAL DISCHARGE SUMMARY   Patient ID: Kimberly Page MRN: [id number] DOB/AGE: 72-Oct-1947 72 y.o.  Admit date: 07/21/2019 Discharge date: 07/22/2019  Discharge Diagnoses Patient Active Problem List   Diagnosis Date Noted  . S/P repair of paraesophageal hernia 07/21/2019  . Pulmonary nodules/lesions, multiple 04/27/2019  . Low serum vitamin D 10/26/2017  . RAD (reactive airway disease), moderate persistent, uncomplicated 12/24/7251  . Medicare annual wellness visit, initial 04/21/2017  . Chronic pain of right lower extremity 10/22/2016  . Pernicious anemia 11/21/2015  . Hemangioma of liver 10/16/2014  . 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 Gastroesophageal reflux disease without esophagitis 10/16/2014  . PAT (paroxysmal atrial tachycardia) ([place]) 10/16/2014  . Female stress incontinence 11/11/2012  . 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 Incomplete emptying of bladder 11/11/2012  . Rectocele 11/11/2012  . Urge incontinence 11/11/2012    Consultants None  Procedures 07/21/2019 Robotic assisted laparoscopic Type III paraesophageal hernia repair with Nissen fundoplication   HPI: Kimberly Page is a 73 y.o. female with a history of symptomatic hiatal hernia and acid reflux who presents to [facility] on 07/30 for scheduled robotic assisted laparoscopic Nissen fundoplication with Dr Dahlia Byes.   Hospital Course: Informed consent was obtained and documented, and patient underwent uneventful robotic assisted Nissen Fundoplication (Dr Dahlia Byes, [id number]).  Post-operatively, patient's pain and acid reflux symptoms improved/resolved and advancement of patient's diet and ambulation were well-tolerated. The remainder of patient's hospital course was essentially unremarkable, and discharge planning was initiated accordingly with patient safely able to be discharged home with appropriate discharge instructions, dietary instructions, pain control, and outpatient  follow-up after all of her questions were answered to her expressed satisfaction.   Discharge Condition: Good   Physical Examination:  Constitutional: Well appearing female, NAD Pulmonary: Normal effort, no respiratory distress Gastrointestinal: Soft, incisional soreness, non-distended, no rebound/guarding Skin: Laparoscopic incisions are CDI with dermabond, no erythema or drainage   Allergies as of 07/22/2019      Reactions   Naproxen Other (See Comments)   passed out. 50 years ago   Propoxyphene Other (See Comments)   Unknown   Pseudoephedrine Hcl Palpitations      Medication List    TAKE these medications   acetaminophen 500 MG tablet Commonly known as: TYLENOL Take 500 mg by mouth every 6 (six) hours as needed for moderate pain or headache.   albuterol 108 (90 Base) MCG/ACT inhaler Commonly known as: VENTOLIN HFA Inhale 2 puffs into the lungs every 6 (six) hours as needed for wheezing or shortness of breath.   aspirin 81 MG tablet Take 81 mg by mouth daily.   CALCIUM 600+D3 PO Take 1 tablet by mouth 2 (two) times a day.   EYE DROPS ADVANCED RELIEF OP Place 1 drop into both eyes daily as needed (for dry eyes).   HYPER-SAL 7 % Nebu Generic drug: Sodium Chloride (Inhalant) Inhale 4 mLs into the lungs every evening.   loratadine 10 MG tablet Commonly known as: CLARITIN Take 10 mg by mouth every morning.   Magnesium 500 MG Tabs Take 500 mg by mouth every morning.   meclizine 25 MG tablet Commonly known as: ANTIVERT Take 25 mg by mouth 3 (three) times daily as needed for dizziness.   metoprolol succinate 50 MG 24 hr tablet Commonly known as: TOPROL-XL Take 50 mg by mouth 2 (two) times daily.   omeprazole 40 MG capsule Commonly known as: PRILOSEC Take 40 mg by mouth every morning.   oxybutynin 5 MG tablet Commonly  known as: DITROPAN Take 5 mg by mouth 2 (two) times daily.   oxyCODONE 5 MG immediate release tablet Commonly known as: Oxy  IR/ROXICODONE Take 1-2 tablets (5-10 mg total) by mouth every 4 (four) hours as needed for moderate pain.   VITAMIN B-12 PO Place 1 tablet under the tongue daily.   vitamin E 200 UNIT capsule Take 200 Units by mouth 4 (four) times a week. Takes Sundays, Mondays, Tuesdays and Wednesdays.   Fluticasone-Salmeterol 250-50 MCG/DOSE Aepb Commonly known as: ADVAIR Inhale 1 puff into the lungs 2 (two) times daily.   Wixela Inhub 250-50 MCG/DOSE Aepb Generic drug: Fluticasone-Salmeterol Inhale 1 puff into the lungs 2 (two) times daily.        Follow-up Information    Pabon, [place] F, MD. Schedule an appointment as soon as possible for a visit in 2 week(s).   Specialty: General Surgery Why: s/p robot Nissen Contact information: [address] [place] [address] [phone]            Time spent on discharge management including discussion of hospital course, clinical condition, outpatient instructions, prescriptions, and follow up with the patient and members of the medical team: >30 minutes  -- Edison Simon , PA-C [facility]  07/22/2019, 11:32 AM [phone] M-F: 7am - 4pm

## 2019-07-22 NOTE — Progress Notes (Signed)
07/22/2019 3:29 PM  Vilma Milderd Meager to be D/C'd Home per MD order.  Discussed prescriptions and follow up appointments with the patient. Prescriptions given to patient, medication list explained in detail. Pt verbalized understanding.  Allergies as of 07/22/2019      Reactions   Naproxen Other (See Comments)   passed out. 50 years ago   Propoxyphene Other (See Comments)   Unknown   Pseudoephedrine Hcl Palpitations      Medication List    TAKE these medications   acetaminophen 500 MG tablet Commonly known as: TYLENOL Take 500 mg by mouth every 6 (six) hours as needed for moderate pain or headache. Notes to patient: As needed   albuterol 108 (90 Base) MCG/ACT inhaler Commonly known as: VENTOLIN HFA Inhale 2 puffs into the lungs every 6 (six) hours as needed for wheezing or shortness of breath. Notes to patient: As needed   aspirin 81 MG tablet Take 81 mg by mouth daily. Notes to patient: Morning 07/23/19   CALCIUM 600+D3 PO Take 1 tablet by mouth 2 (two) times a day. Notes to patient: Evening 07/22/19   EYE DROPS ADVANCED RELIEF OP Place 1 drop into both eyes daily as needed (for dry eyes). Notes to patient: As needed   HYPER-SAL 7 % Nebu Generic drug: Sodium Chloride (Inhalant) Inhale 4 mLs into the lungs every evening. Notes to patient: Evening 07/22/19   loratadine 10 MG tablet Commonly known as: CLARITIN Take 10 mg by mouth every morning. Notes to patient: Morning 07/23/19   Magnesium 500 MG Tabs Take 500 mg by mouth every morning. Notes to patient: Morning 07/23/19   meclizine 25 MG tablet Commonly known as: ANTIVERT Take 25 mg by mouth 3 (three) times daily as needed for dizziness. Notes to patient: As needed   metoprolol succinate 50 MG 24 hr tablet Commonly known as: TOPROL-XL Take 50 mg by mouth 2 (two) times daily. Notes to patient: Before bed 07/22/19   omeprazole 40 MG capsule Commonly known as: PRILOSEC Take 40 mg by mouth every morning. Notes to  patient: Morning 07/23/19   oxybutynin 5 MG tablet Commonly known as: DITROPAN Take 5 mg by mouth 2 (two) times daily. Notes to patient: Before bed 07/22/19   oxyCODONE 5 MG immediate release tablet Commonly known as: Oxy IR/ROXICODONE Take 1-2 tablets (5-10 mg total) by mouth every 4 (four) hours as needed for moderate pain. Notes to patient: As needed   VITAMIN B-12 PO Place 1 tablet under the tongue daily. Notes to patient: Morning 07/23/19   vitamin E 200 UNIT capsule Take 200 Units by mouth 4 (four) times a week. Takes Sundays, Mondays, Tuesdays and Wednesdays. Notes to patient: Morning 07/25/19   Fluticasone-Salmeterol 250-50 MCG/DOSE Aepb Commonly known as: ADVAIR Inhale 1 puff into the lungs 2 (two) times daily. Notes to patient: Before bed 07/22/19   Wixela Inhub 250-50 MCG/DOSE Aepb Generic drug: Fluticasone-Salmeterol Inhale 1 puff into the lungs 2 (two) times daily. Notes to patient: Before bed 07/22/19       Vitals:   07/22/19 0525 07/22/19 1138  BP: 136/68 (!) 155/82  Pulse: 63 69  Resp: 20 16  Temp: 97.7 F (36.5 C) 98.1 F (36.7 C)  SpO2: 98% 100%    Skin clean, dry and intact without evidence of skin break down, no evidence of skin tears noted. IV catheter discontinued intact. Site without signs and symptoms of complications. Dressing and pressure applied. Pt denies pain at this time. No complaints noted.  An After Visit Summary was printed and given to the patient. Patient escorted via [place], and D/C home via private auto.  Dola Argyle

## 2019-08-03 ENCOUNTER — Encounter: Payer: Self-pay | Admitting: Surgery

## 2019-08-03 ENCOUNTER — Ambulatory Visit (INDEPENDENT_AMBULATORY_CARE_PROVIDER_SITE_OTHER): Payer: Medicare Other | Admitting: Surgery

## 2019-08-03 ENCOUNTER — Other Ambulatory Visit: Payer: Self-pay

## 2019-08-03 ENCOUNTER — Telehealth: Payer: Self-pay | Admitting: *Deleted

## 2019-08-03 VITALS — BP 127/77 | HR 61 | Temp 97.7°F | Ht 64.0 in | Wt 182.6 lb

## 2019-08-03 DIAGNOSIS — Z09 Encounter for follow-up examination after completed treatment for conditions other than malignant neoplasm: Secondary | ICD-10-CM

## 2019-08-03 NOTE — Patient Instructions (Signed)
   Follow-up with our office as needed.  Please call and ask to speak with a nurse if you develop questions or concerns.  

## 2019-08-03 NOTE — Telephone Encounter (Signed)
Patient called and has some questions regarding her visit this morning and wants a nurse to contact her.

## 2019-08-03 NOTE — Telephone Encounter (Signed)
Patient had questions regarding starting new foods after surgery. Patient instructed to gradually add foods back into her diet. She should take in small amounts of new foods to see how she reacts to them. She is aware to call back if she has any further questions.

## 2019-08-05 NOTE — Progress Notes (Signed)
S/p robotic  paraesophageal hernia repair Doing very well Taking Po well No dysphagia, no reflux Pulm sxs much improved, no fevers   PE NAD Abd: soft, nt, incisions c/d/i, no infection  A/p Doing very well w/o complications No heavy lifting RTC prn

## 2019-09-19 ENCOUNTER — Other Ambulatory Visit: Payer: Self-pay | Admitting: Internal Medicine

## 2019-09-19 DIAGNOSIS — Z1231 Encounter for screening mammogram for malignant neoplasm of breast: Secondary | ICD-10-CM

## 2019-11-10 ENCOUNTER — Ambulatory Visit
Admission: RE | Admit: 2019-11-10 | Discharge: 2019-11-10 | Disposition: A | Discharge status: Home / Self Care | Payer: Medicare Other | Source: Ambulatory Visit | Attending: Pulmonary Disease | Admitting: Pulmonary Disease

## 2019-11-10 ENCOUNTER — Other Ambulatory Visit: Payer: Self-pay

## 2019-11-10 DIAGNOSIS — R918 Other nonspecific abnormal finding of lung field: Secondary | ICD-10-CM | POA: Insufficient documentation

## 2019-11-10 DIAGNOSIS — R0602 Shortness of breath: Secondary | ICD-10-CM | POA: Diagnosis not present

## 2019-12-20 ENCOUNTER — Ambulatory Visit
Admission: RE | Admit: 2019-12-20 | Discharge: 2019-12-20 | Disposition: A | Discharge status: Home / Self Care | Payer: Medicare Other | Source: Ambulatory Visit | Attending: Internal Medicine | Admitting: Internal Medicine

## 2019-12-20 DIAGNOSIS — Z1231 Encounter for screening mammogram for malignant neoplasm of breast: Secondary | ICD-10-CM | POA: Insufficient documentation

## 2019-12-31 ENCOUNTER — Other Ambulatory Visit
Admission: RE | Admit: 2019-12-31 | Discharge: 2019-12-31 | Disposition: A | Discharge status: Home / Self Care | Payer: Medicare Other | Source: Ambulatory Visit | Attending: Student | Admitting: Student

## 2019-12-31 DIAGNOSIS — K529 Noninfective gastroenteritis and colitis, unspecified: Secondary | ICD-10-CM | POA: Insufficient documentation

## 2020-01-03 LAB — CALPROTECTIN, FECAL: Calprotectin, Fecal: 25 ug/g (ref 0–120)

## 2020-01-06 LAB — PANCREATIC ELASTASE, FECAL: Pancreatic Elastase-1, Stool: 337 ug Elast./g (ref >200)

## 2020-01-14 ENCOUNTER — Emergency Department: Payer: Medicare Other

## 2020-01-14 ENCOUNTER — Other Ambulatory Visit: Payer: Self-pay

## 2020-01-14 ENCOUNTER — Emergency Department
Admission: EM | Admit: 2020-01-14 | Discharge: 2020-01-14 | Disposition: A | Discharge status: Home / Self Care | Payer: Medicare Other | Attending: Emergency Medicine | Admitting: Emergency Medicine

## 2020-01-14 ENCOUNTER — Encounter: Payer: Self-pay | Admitting: Emergency Medicine

## 2020-01-14 DIAGNOSIS — J449 Chronic obstructive pulmonary disease, unspecified: Secondary | ICD-10-CM | POA: Diagnosis not present

## 2020-01-14 DIAGNOSIS — I471 Supraventricular tachycardia: Secondary | ICD-10-CM | POA: Diagnosis not present

## 2020-01-14 DIAGNOSIS — R000 Tachycardia, unspecified: Secondary | ICD-10-CM | POA: Diagnosis present

## 2020-01-14 DIAGNOSIS — Z79899 Other long term (current) drug therapy: Secondary | ICD-10-CM | POA: Diagnosis not present

## 2020-01-14 DIAGNOSIS — I4891 Unspecified atrial fibrillation: Secondary | ICD-10-CM | POA: Diagnosis not present

## 2020-01-14 DIAGNOSIS — Z7982 Long term (current) use of aspirin: Secondary | ICD-10-CM | POA: Diagnosis not present

## 2020-01-14 DIAGNOSIS — Z9889 Other specified postprocedural states: Secondary | ICD-10-CM | POA: Insufficient documentation

## 2020-01-14 LAB — COMPREHENSIVE METABOLIC PANEL
ALT: 85 U/L — ABNORMAL HIGH (ref 0–44)
AST: 30 U/L (ref 15–41)
Albumin: 3.7 g/dL (ref 3.5–5.0)
Alkaline Phosphatase: 87 U/L (ref 38–126)
Anion gap: 9 (ref 5–15)
BUN: 11 mg/dL (ref 8–23)
CO2: 26 mmol/L (ref 22–32)
Calcium: 8.6 mg/dL — ABNORMAL LOW (ref 8.9–10.3)
Chloride: 106 mmol/L (ref 98–111)
Creatinine, Ser: 0.6 mg/dL (ref 0.44–1.00)
GFR calc Af Amer: >60 mL/min (ref >60)
GFR calc non Af Amer: >60 mL/min (ref >60)
Glucose, Bld: 95 mg/dL (ref 70–99)
Potassium: 4.2 mmol/L (ref 3.5–5.1)
Sodium: 141 mmol/L (ref 135–145)
Total Bilirubin: 0.7 mg/dL (ref 0.3–1.2)
Total Protein: 6.7 g/dL (ref 6.5–8.1)

## 2020-01-14 LAB — CBC WITH DIFFERENTIAL/PLATELET
Abs Immature Granulocytes: 0.04 10*3/uL (ref 0.00–0.07)
Basophils Absolute: 0 10*3/uL (ref 0.0–0.1)
Basophils Relative: 1 %
Eosinophils Absolute: 0.1 10*3/uL (ref 0.0–0.5)
Eosinophils Relative: 2 %
HCT: 45.9 % (ref 36.0–46.0)
Hemoglobin: 14.7 g/dL (ref 12.0–15.0)
Immature Granulocytes: 1 %
Lymphocytes Relative: 19 %
Lymphs Abs: 1.5 10*3/uL (ref 0.7–4.0)
MCH: 28.5 pg (ref 26.0–34.0)
MCHC: 32 g/dL (ref 30.0–36.0)
MCV: 89 fL (ref 80.0–100.0)
Monocytes Absolute: 0.5 10*3/uL (ref 0.1–1.0)
Monocytes Relative: 6 %
Neutro Abs: 5.8 10*3/uL (ref 1.7–7.7)
Neutrophils Relative %: 71 %
Platelets: 288 10*3/uL (ref 150–400)
RBC: 5.16 MIL/uL — ABNORMAL HIGH (ref 3.87–5.11)
RDW: 13.1 % (ref 11.5–15.5)
WBC: 8 10*3/uL (ref 4.0–10.5)
nRBC: 0 % (ref 0.0–0.2)

## 2020-01-14 LAB — TROPONIN I (HIGH SENSITIVITY)
Troponin I (High Sensitivity): 4 ng/L (ref <18)
Troponin I (High Sensitivity): 3 ng/L (ref <18)

## 2020-01-14 LAB — MAGNESIUM: Magnesium: 1.9 mg/dL (ref 1.7–2.4)

## 2020-01-14 LAB — TSH: TSH: 3.188 u[IU]/mL (ref 0.350–4.500)

## 2020-01-14 LAB — T4, FREE: Free T4: 0.86 ng/dL (ref 0.61–1.12)

## 2020-01-14 NOTE — Discharge Instructions (Addendum)
Your work-up was reassuring.  You most likely wanted to run your atrial tachycardias.  You should follow-up with cardiology next week.  Return to the ER for shortness of breath, chest pain or any other concerns

## 2020-01-14 NOTE — ED Provider Notes (Signed)
[facility] Emergency Department Provider Note  ____________________________________________   First MD Initiated Contact with Patient 01/14/20 1000     (approximate)  I have reviewed the triage vital signs and the nursing notes.   HISTORY  Chief Complaint Tachycardia    HPI Kimberly Page is a 73 y.o. female with history of atrial fibrillation not on anticoagulation but on metoprolol who comes in from surgery clinic after colonoscopy.  Patient had a colonoscopy completed and went into what looked like a paroxysmal atrial tachycardia.  Patient was given a total of 10 mg of IV metoprolol in divided doses as well as a lidocaine bolus.  Blood pressures are stable and oxygen levels were stable throughout her elevated heart rates.  Heart rate came down from the 160s to the 120s.  Patient's mental status seemed to be okay but was coming off of her anesthesia.  On upon arrival to the ER patient is in normal sinus in the 60s.  She denies any chest pain, shortness of breath.  Denies a history of heart attacks.            Past Medical History:  Diagnosis Date  . A-fib ([place])   . Abnormal chest x-ray with multiple lung nodules 05/2019  . Arthritis    left foot  . Carpal tunnel syndrome   . Complication of anesthesia   . COPD (chronic obstructive pulmonary disease) ([place])   . Dyspnea    WITH EXERTION  . Dysrhythmia    atrial fibrillation  . Family history of adverse reaction to anesthesia    pt unsure-mom died at a young age and unsure about dad  . GERD (gastroesophageal reflux disease)   . Hemangioma of liver   . History of hiatal hernia   . Pinched nerve in shoulder, left    LEFT  . PONV (postoperative nausea and vomiting) 1958   had eye surgery 5th grade and she got sick   . Urinary incontinence   . Vertigo     Patient Active Problem List   Diagnosis Date Noted  . S/P repair of paraesophageal hernia 07/21/2019  . Pulmonary nodules/lesions,  multiple 04/27/2019  . Low serum vitamin D 10/26/2017  . RAD (reactive airway disease), moderate persistent, uncomplicated [id number]  . Medicare annual wellness visit, initial 04/21/2017  . Chronic pain of right lower extremity 10/22/2016  . Pernicious anemia 11/21/2015  . Hemangioma of liver 10/16/2014  . 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 Gastroesophageal reflux disease without esophagitis 10/16/2014  . PAT (paroxysmal atrial tachycardia) ([place]) 10/16/2014  . Female stress incontinence 11/11/2012  . 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 Incomplete emptying of bladder 11/11/2012  . Rectocele 11/11/2012  . Urge incontinence 11/11/2012    Past Surgical History:  Procedure Laterality Date  . ABDOMINAL HYSTERECTOMY    . BLADDER SUSPENSION  01/24/2008  . BREAST BIOPSY Left 12/2004   core- neg  . CATARACT EXTRACTION Left 06/14/2013  . CATARACT EXTRACTION Right 06/28/2013  . CHOLECYSTECTOMY  08/2008  . COLONOSCOPY    . DIAGNOSTIC LAPAROSCOPY    . ESOPHAGOGASTRODUODENOSCOPY (EGD) WITH PROPOFOL N/A 06/03/2019   Procedure: ESOPHAGOGASTRODUODENOSCOPY (EGD) WITH PROPOFOL;  Surgeon: Lollie Sails, MD;  Location: [facility] ENDOSCOPY;  Service: Endoscopy;  Laterality: N/A;  . EYE SURGERY Right 1958  . 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 Foot still partially numb.  PINS  . HEMORROIDECTOMY  11/1977  . HERNIA REPAIR  01/24/2008  . KNEE  ARTHROSCOPY Right   . KNEE SURGERY Left 12/1997  . LARYNGOSCOPY  11/2005  . reactive airway disease    . seasonal allergies    . TUBAL LIGATION    . TYMPANOSTOMY TUBE PLACEMENT  1977  . VAGINAL HYSTERECTOMY  04/1986    Prior to Admission medications   Medication Sig Start Date End Date Taking? Authorizing Provider  acetaminophen (TYLENOL) 500 MG tablet Take 500 mg by mouth every 6 (six) hours as needed for moderate pain or headache.     [provider]  albuterol (PROVENTIL  HFA;VENTOLIN HFA) 108 (90 Base) MCG/ACT inhaler Inhale 2 puffs into the lungs every 6 (six) hours as needed for wheezing or shortness of breath. 01/31/19   Laverle Hobby, MD  aspirin 81 MG tablet Take 81 mg by mouth daily.    [provider]  Calcium Carb-Cholecalciferol (CALCIUM 600+D3 PO) Take 1 tablet by mouth 2 (two) times a day.    [provider]  Cyanocobalamin (VITAMIN B-12 PO) Place 1 tablet under the tongue daily.     [provider]  Fluticasone-Salmeterol (ADVAIR) 250-50 MCG/DOSE AEPB Inhale 1 puff into the lungs 2 (two) times daily.    [provider]  loratadine (CLARITIN) 10 MG tablet Take 10 mg by mouth every morning.     [provider]  Magnesium 500 MG TABS Take 500 mg by mouth every morning.     [provider]  meclizine (ANTIVERT) 25 MG tablet Take 25 mg by mouth 3 (three) times daily as needed for dizziness.     [provider]  metoprolol succinate (TOPROL-XL) 50 MG 24 hr tablet Take 50 mg by mouth 2 (two) times daily.     [provider]  omeprazole (PRILOSEC) 40 MG capsule Take 40 mg by mouth every morning.     [provider]  oxybutynin (DITROPAN) 5 MG tablet Take 5 mg by mouth 2 (two) times daily.    [provider]  Sodium Chloride, Inhalant, (HYPER-SAL) 7 % NEBU Inhale 4 mLs into the lungs every evening.     [provider]  Tetrahydroz-Dextran-PEG-Povid (EYE DROPS ADVANCED RELIEF OP) Place 1 drop into both eyes daily as needed (for dry eyes).    [provider]  vitamin E 200 UNIT capsule Take 200 Units by mouth 4 (four) times a week. Takes Sundays, Mondays, Tuesdays and Wednesdays.    [provider]  Grant Ruts INHUB 250-50 MCG/DOSE AEPB Inhale 1 puff into the lungs 2 (two) times daily. 01/31/19   Laverle Hobby, MD    Allergies Naproxen, Propoxyphene, and Pseudoephedrine hcl  Family History  Problem Relation Age of Onset  . Hypertension  Mother   . Stroke Mother   . Leukemia Father   . Cancer Other   . Breast cancer Paternal Aunt   . Breast cancer Cousin     Social History Social History   Tobacco Use  . Smoking status: Former Smoker    Packs/day: 1.50    Years: 45.00    Pack years: 67.50    Types: Cigarettes    Quit date: 12/23/1999    Years since quitting: 20.0  . Smokeless tobacco: Never Used  Substance Use Topics  . Alcohol use: Yes    Alcohol/week: 0.0 standard drinks    Comment: wine occ  . Drug use: No      Review of Systems Constitutional: No fever/chills Eyes: No visual changes. ENT: No sore throat. Cardiovascular: Denies chest pain. + fast heart rates.  Respiratory: Denies shortness of breath. Gastrointestinal: No abdominal pain.  No nausea, no vomiting.  No diarrhea.  No constipation. Genitourinary: Negative for dysuria. Musculoskeletal: Negative for back pain. Skin: Negative for rash. Neurological: Negative for headaches, focal weakness or numbness. All other ROS negative ____________________________________________   PHYSICAL EXAM:  VITAL SIGNS: ED Triage Vitals [01/14/20 1006]  Enc Vitals Group     BP      Pulse      Resp      Temp      Temp src      SpO2      Weight 156 lb (70.8 kg)     Height 5\' 4"  (1.626 m)     Head Circumference      Peak Flow      Pain Score 0     Pain Loc      Pain Edu?      Excl. in [place]?     Constitutional: Alert and oriented. Well appearing and in no acute distress. Eyes: Conjunctivae are normal. EOMI. Head: Atraumatic. Nose: No congestion/rhinnorhea. Mouth/Throat: Mucous membranes are moist.   Neck: No stridor. Trachea Midline. FROM Cardiovascular: Normal rate, regular rhythm. Grossly normal heart sounds.  Good peripheral circulation. Respiratory: Normal respiratory effort.  No retractions. Lungs CTAB. Gastrointestinal: Soft and nontender. No distention. No abdominal bruits.  Musculoskeletal: No lower extremity tenderness nor edema.  No  joint effusions. Neurologic:  Normal speech and language. No gross focal neurologic deficits are appreciated.  Skin:  Skin is warm, dry and intact. No rash noted. Psychiatric: Mood and affect are normal. Speech and behavior are normal. GU: Deferred   ____________________________________________   LABS (all labs ordered are listed, but only abnormal results are displayed)  Labs Reviewed  CBC WITH DIFFERENTIAL/PLATELET - Abnormal; Notable for the following components:      Result Value   RBC 5.16 (*)    All other components within normal limits  COMPREHENSIVE METABOLIC PANEL - Abnormal; Notable for the following components:   Calcium 8.6 (*)    ALT 85 (*)    All other components within normal limits  MAGNESIUM  TSH  T4, FREE  TROPONIN I (HIGH SENSITIVITY)  TROPONIN I (HIGH SENSITIVITY)   ____________________________________________   ED ECG REPORT I, Vanessa Akhiok, the attending physician, personally viewed and interpreted this ECG.  EKG is sinus rate of 64, no ST elevation, no T wave inversions, normal intervals ____________________________________________  RADIOLOGY Robert Bellow, personally viewed and evaluated these images (plain radiographs) as part of my medical decision making, as well as reviewing the written report by the radiologist.  ED MD interpretation:  No PNA   Official radiology report(s): DG Chest Portable 1 View  Result Date: 01/14/2020 CLINICAL DATA:  SVT EXAM: PORTABLE CHEST 1 VIEW COMPARISON:  CT chest dated 11/10/2019 FINDINGS: Lungs are essentially clear. Known bilateral lower lobe pulmonary nodules are better visualized on prior CT. No pleural effusion or pneumothorax. The heart is normal in size. Thoracic aortic atherosclerosis. IMPRESSION: No evidence of acute cardiopulmonary disease. Known bilateral lower lobe pulmonary nodules are better visualized on prior CT. Electronically Signed   By: Julian Hy M.D.   On: 01/14/2020 11:05     ____________________________________________   PROCEDURES  Procedure(s) performed (including Critical Care):  Procedures   ____________________________________________   INITIAL IMPRESSION / ASSESSMENT AND PLAN / ED COURSE  Kimberly Page was evaluated in Emergency Department on 01/14/2020 for the symptoms described in the history of present illness. She  was evaluated in the context of the global COVID-19 pandemic, which necessitated consideration that the patient might be at risk for infection with the SARS-CoV-2 virus that causes COVID-19. Institutional protocols and algorithms that pertain to the evaluation of patients at risk for COVID-19 are in a state of rapid change based on information released by regulatory bodies including the CDC and federal and state organizations. These policies and algorithms were followed during the patient's care in the ED.    Patient is a well-appearing 74 year old who presents after colonoscopy for elevated heart rates.  Given patient history of atrial tachycardia suspect that this is most likely what happened.  I did get an EKG from EMS unit but does show a widened QRS with a left bundle branch block.  I suspect that is more likely a wide-complex tachycardia with aberrancy over ventricular tachycardia given her history of atrial tachycardias and the fact that the rate was only 120 which would be atypical for VT.  Will get labs to evaluate for Electra abnormalities, AKI, ACS, chest x-ray to evaluate for aspiration.  Patient is completely asymptomatic at this time.  We will continue to monitor her heart rates.  Currently patient is in normal sinus.  Labs reviewed and are unremarkable.  Patient is followed by Dr. Conni Elliot clinic.  D/w Dr. Clayborn Bigness and reviewed EKG from EMS and agrees not VT. Agrees with f.u with Dr. Ubaldo Glassing next week.   I discussed observation admission for cardiac monitoring versus discharge.  Patient is completely asymptomatic and  would prefer to go home at this time  I discussed the provisional nature of ED diagnosis, the treatment so far, the ongoing plan of care, follow up appointments and return precautions with the patient and any family or support people present. They expressed understanding and agreed with the plan, discharged home.    ____________________________________________   FINAL CLINICAL IMPRESSION(S) / ED DIAGNOSES   Final diagnoses:  Atrial tachycardia ([place])      MEDICATIONS GIVEN DURING THIS VISIT:  Medications - No data to display   ED Discharge Orders    None       Note:  This document was prepared using Dragon voice recognition software and may include unintentional dictation errors.   Vanessa Altheimer, MD 01/14/20 1349

## 2020-01-14 NOTE — ED Triage Notes (Addendum)
Pt via EMS from surgical center. Pt was there for a colonoscopy. Pt went into SVT and per Dr. Alice Reichert, MD the complexes began to widen. Per EMS, pt HR went approx. to 200bpm. Per EMS, pt was given fentanyl, metoprolol, lidocaine, 1L of fluid, and versed. Pt is A&Ox4 and denies pain at this time. Current HR right now 65

## 2020-06-04 ENCOUNTER — Other Ambulatory Visit: Payer: Self-pay

## 2020-06-04 ENCOUNTER — Emergency Department: Payer: Medicare Other

## 2020-06-04 ENCOUNTER — Emergency Department
Admission: EM | Admit: 2020-06-04 | Discharge: 2020-06-04 | Disposition: A | Discharge status: Home / Self Care | Payer: Medicare Other | Attending: Emergency Medicine | Admitting: Emergency Medicine

## 2020-06-04 DIAGNOSIS — Z79899 Other long term (current) drug therapy: Secondary | ICD-10-CM | POA: Insufficient documentation

## 2020-06-04 DIAGNOSIS — R200 Anesthesia of skin: Secondary | ICD-10-CM | POA: Diagnosis present

## 2020-06-04 DIAGNOSIS — E860 Dehydration: Secondary | ICD-10-CM

## 2020-06-04 DIAGNOSIS — Z87891 Personal history of nicotine dependence: Secondary | ICD-10-CM | POA: Insufficient documentation

## 2020-06-04 DIAGNOSIS — Z7982 Long term (current) use of aspirin: Secondary | ICD-10-CM | POA: Insufficient documentation

## 2020-06-04 DIAGNOSIS — T50905A Adverse effect of unspecified drugs, medicaments and biological substances, initial encounter: Secondary | ICD-10-CM | POA: Diagnosis not present

## 2020-06-04 LAB — MAGNESIUM: Magnesium: 1.7 mg/dL (ref 1.7–2.4)

## 2020-06-04 LAB — URINALYSIS, COMPLETE (UACMP) WITH MICROSCOPIC
Bacteria, UA: NONE SEEN
Bilirubin Urine: NEGATIVE
Glucose, UA: NEGATIVE mg/dL
Ketones, ur: 5 mg/dL — AB
Nitrite: NEGATIVE
Protein, ur: 30 mg/dL — AB
Specific Gravity, Urine: 1.024 (ref 1.005–1.030)
pH: 5 (ref 5.0–8.0)

## 2020-06-04 LAB — CBC WITH DIFFERENTIAL/PLATELET
Abs Immature Granulocytes: 0.07 10*3/uL (ref 0.00–0.07)
Basophils Absolute: 0 10*3/uL (ref 0.0–0.1)
Basophils Relative: 0 %
Eosinophils Absolute: 0 10*3/uL (ref 0.0–0.5)
Eosinophils Relative: 0 %
HCT: 40.5 % (ref 36.0–46.0)
Hemoglobin: 14 g/dL (ref 12.0–15.0)
Immature Granulocytes: 1 %
Lymphocytes Relative: 11 %
Lymphs Abs: 1.3 10*3/uL (ref 0.7–4.0)
MCH: 30 pg (ref 26.0–34.0)
MCHC: 34.6 g/dL (ref 30.0–36.0)
MCV: 86.9 fL (ref 80.0–100.0)
Monocytes Absolute: 0.7 10*3/uL (ref 0.1–1.0)
Monocytes Relative: 6 %
Neutro Abs: 9.5 10*3/uL — ABNORMAL HIGH (ref 1.7–7.7)
Neutrophils Relative %: 82 %
Platelets: 290 10*3/uL (ref 150–400)
RBC: 4.66 MIL/uL (ref 3.87–5.11)
RDW: 12.7 % (ref 11.5–15.5)
WBC: 11.6 10*3/uL — ABNORMAL HIGH (ref 4.0–10.5)
nRBC: 0 % (ref 0.0–0.2)

## 2020-06-04 LAB — COMPREHENSIVE METABOLIC PANEL
ALT: 42 U/L (ref 0–44)
AST: 22 U/L (ref 15–41)
Albumin: 4.2 g/dL (ref 3.5–5.0)
Alkaline Phosphatase: 70 U/L (ref 38–126)
Anion gap: 10 (ref 5–15)
BUN: 23 mg/dL (ref 8–23)
CO2: 24 mmol/L (ref 22–32)
Calcium: 9 mg/dL (ref 8.9–10.3)
Chloride: 99 mmol/L (ref 98–111)
Creatinine, Ser: 0.67 mg/dL (ref 0.44–1.00)
GFR calc Af Amer: >60 mL/min (ref >60)
GFR calc non Af Amer: >60 mL/min (ref >60)
Glucose, Bld: 132 mg/dL — ABNORMAL HIGH (ref 70–99)
Potassium: 3.5 mmol/L (ref 3.5–5.1)
Sodium: 133 mmol/L — ABNORMAL LOW (ref 135–145)
Total Bilirubin: 0.6 mg/dL (ref 0.3–1.2)
Total Protein: 7 g/dL (ref 6.5–8.1)

## 2020-06-04 LAB — TSH: TSH: 2.271 u[IU]/mL (ref 0.350–4.500)

## 2020-06-04 MED ORDER — SODIUM CHLORIDE 0.9 % IV BOLUS
1000.0000 mL | Freq: Once | INTRAVENOUS | Status: AC
  Administered 2020-06-04: 1000 mL via INTRAVENOUS

## 2020-06-04 NOTE — ED Provider Notes (Addendum)
[facility] Emergency Department Provider Note  ____________________________________________   First MD Initiated Contact with Patient 06/04/20 1159     (approximate)  I have reviewed the triage vital signs and the nursing notes.   HISTORY  Chief Complaint Facial numbness    HPI Kimberly Page is a 74 y.o. female  Here with [facility]. Pt reports that at the end of last week, she was started on citalopram and verapamil and her mesalamine and metop were stopped. Since then she has had progressively worsening dry mouth, anxiety, and feeling of "jitteriness." Earlier today at work, she felt like she had difficulty getting words out 2/2 this dry mouth and her tongue feeling "heavy." Denies any aphasia. She had some tingling and numbness feeling in her b/l upper cheeks and forehead. No focal numbness, weakness. No other neuro sx. She does note she tried meclizine yesterday for her "jitteriness" without relief. No other new OTC meds. No headache. No recent falls. No CP, SOB, palpitations.        Past Medical History:  Diagnosis Date  . A-fib ([place])   . Abnormal chest x-ray with multiple lung nodules 05/2019  . Arthritis    left foot  . Carpal tunnel syndrome   . Complication of anesthesia   . COPD (chronic obstructive pulmonary disease) ([place])   . Dyspnea    WITH EXERTION  . Dysrhythmia    atrial fibrillation  . Family history of adverse reaction to anesthesia    pt unsure-mom died at a young age and unsure about dad  . GERD (gastroesophageal reflux disease)   . Hemangioma of liver   . History of hiatal hernia   . Pinched nerve in shoulder, left    LEFT  . PONV (postoperative nausea and vomiting) 1958   had eye surgery 5th grade and she got sick   . Urinary incontinence   . Vertigo     Patient Active Problem List   Diagnosis Date Noted  . S/P repair of paraesophageal hernia 07/21/2019  . Pulmonary nodules/lesions, multiple 04/27/2019  . Low serum  vitamin D 10/26/2017  . RAD (reactive airway disease), moderate persistent, uncomplicated 33/82/5053  . Medicare annual wellness visit, initial 04/21/2017  . Chronic pain of right lower extremity 10/22/2016  . Pernicious anemia 11/21/2015  . Hemangioma of liver 10/16/2014  . 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 Gastroesophageal reflux disease without esophagitis 10/16/2014  . PAT (paroxysmal atrial tachycardia) ([place]) 10/16/2014  . Female stress incontinence 11/11/2012  . 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 Incomplete emptying of bladder 11/11/2012  . Rectocele 11/11/2012  . Urge incontinence 11/11/2012    Past Surgical History:  Procedure Laterality Date  . ABDOMINAL HYSTERECTOMY    . BLADDER SUSPENSION  01/24/2008  . BREAST BIOPSY Left 12/2004   core- neg  . CATARACT EXTRACTION Left 06/14/2013  . CATARACT EXTRACTION Right 06/28/2013  . CHOLECYSTECTOMY  08/2008  . COLONOSCOPY    . DIAGNOSTIC LAPAROSCOPY    . ESOPHAGOGASTRODUODENOSCOPY (EGD) WITH PROPOFOL N/A 06/03/2019   Procedure: ESOPHAGOGASTRODUODENOSCOPY (EGD) WITH PROPOFOL;  Surgeon: Lollie Sails, MD;  Location: [facility] ENDOSCOPY;  Service: Endoscopy;  Laterality: N/A;  . EYE SURGERY Right 1958  . 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 Foot still partially numb.  PINS  . HEMORROIDECTOMY  11/1977  . HERNIA REPAIR  01/24/2008  . KNEE ARTHROSCOPY Right   . KNEE SURGERY Left 12/1997  . LARYNGOSCOPY  11/2005  . reactive  airway disease    . seasonal allergies    . TUBAL LIGATION    . TYMPANOSTOMY TUBE PLACEMENT  1977  . VAGINAL HYSTERECTOMY  04/1986    Prior to Admission medications   Medication Sig Start Date End Date Taking? Authorizing Provider  acetaminophen (TYLENOL) 500 MG tablet Take 500 mg by mouth every 6 (six) hours as needed for moderate pain or headache.     [provider]  albuterol (PROVENTIL HFA;VENTOLIN HFA) 108 (90 Base) MCG/ACT  inhaler Inhale 2 puffs into the lungs every 6 (six) hours as needed for wheezing or shortness of breath. 01/31/19   Laverle Hobby, MD  aspirin 81 MG tablet Take 81 mg by mouth daily.    [provider]  Calcium Carb-Cholecalciferol (CALCIUM 600+D3 PO) Take 1 tablet by mouth 2 (two) times a day.    [provider]  Cyanocobalamin (VITAMIN B-12 PO) Place 1 tablet under the tongue daily.     [provider]  Fluticasone-Salmeterol (ADVAIR) 250-50 MCG/DOSE AEPB Inhale 1 puff into the lungs 2 (two) times daily.    [provider]  loratadine (CLARITIN) 10 MG tablet Take 10 mg by mouth every morning.     [provider]  Magnesium 500 MG TABS Take 500 mg by mouth every morning.     [provider]  metoprolol succinate (TOPROL-XL) 50 MG 24 hr tablet Take 50 mg by mouth 2 (two) times daily.     [provider]  omeprazole (PRILOSEC) 40 MG capsule Take 40 mg by mouth every morning.     [provider]  oxybutynin (DITROPAN) 5 MG tablet Take 5 mg by mouth 2 (two) times daily.    [provider]  Sodium Chloride, Inhalant, (HYPER-SAL) 7 % NEBU Inhale 4 mLs into the lungs every evening.     [provider]  Tetrahydroz-Dextran-PEG-Povid (EYE DROPS ADVANCED RELIEF OP) Place 1 drop into both eyes daily as needed (for dry eyes).    [provider]  vitamin E 200 UNIT capsule Take 200 Units by mouth 4 (four) times a week. Takes Sundays, Mondays, Tuesdays and Wednesdays.    [provider]  Grant Ruts INHUB 250-50 MCG/DOSE AEPB Inhale 1 puff into the lungs 2 (two) times daily. 01/31/19   Laverle Hobby, MD    Allergies Naproxen, Propoxyphene, and Pseudoephedrine hcl  Family History  Problem Relation Age of Onset  . Hypertension Mother   . Stroke Mother   . Leukemia Father   . Cancer Other   . Breast cancer Paternal Aunt   . Breast cancer Cousin     Social History Social History    Tobacco Use  . Smoking status: Former Smoker    Packs/day: 1.50    Years: 45.00    Pack years: 67.50    Types: Cigarettes    Quit date: 12/23/1999    Years since quitting: 20.4  . Smokeless tobacco: Never Used  Vaping Use  . Vaping Use: Never used  Substance Use Topics  . Alcohol use: Yes    Alcohol/week: 0.0 standard drinks    Comment: wine occ  . Drug use: No    Review of Systems  Review of Systems  Constitutional: Positive for fatigue. Negative for fever.  HENT: Negative for congestion and sore throat.   Eyes: Negative for visual disturbance.  Respiratory: Negative for cough and shortness of breath.   Cardiovascular: Negative for chest pain.  Gastrointestinal: Positive for nausea (ongoing). Negative for abdominal pain, diarrhea and  vomiting.  Genitourinary: Negative for flank pain.  Musculoskeletal: Negative for back pain and neck pain.  Skin: Negative for rash and wound.  Neurological: Positive for dizziness and light-headedness. Negative for weakness.  All other systems reviewed and are negative.    ____________________________________________  PHYSICAL EXAM:      VITAL SIGNS: ED Triage Vitals [06/04/20 1128]  Enc Vitals Group     BP (!) 163/80     Pulse Rate 80     Resp 20     Temp 98.3 F (36.8 C)     Temp Source Oral     SpO2 100 %     Weight 151 lb (68.5 kg)     Height 5\' 5"  (1.651 m)     Head Circumference      Peak Flow      Pain Score 0     Pain Loc      Pain Edu?      Excl. in [place]?      Physical Exam Vitals and nursing note reviewed.  Constitutional:      General: She is not in acute distress.    Appearance: She is well-developed.  HENT:     Head: Normocephalic and atraumatic.     Mouth/Throat:     Mouth: Mucous membranes are dry.  Eyes:     Conjunctiva/sclera: Conjunctivae normal.  Cardiovascular:     Rate and Rhythm: Normal rate and regular rhythm.     Heart sounds: Normal heart sounds. No murmur heard.  No friction rub.   Pulmonary:     Effort: Pulmonary effort is normal. No respiratory distress.     Breath sounds: Normal breath sounds. No wheezing or rales.  Abdominal:     General: There is no distension.     Palpations: Abdomen is soft.     Tenderness: There is no abdominal tenderness.  Musculoskeletal:     Cervical back: Neck supple.  Skin:    General: Skin is warm.     Capillary Refill: Capillary refill takes less than 2 seconds.  Neurological:     Mental Status: She is alert and oriented to person, place, and time.     Motor: No abnormal muscle tone.     Comments: Neurological Exam:  Mental Status: Alert and oriented to person, place, and time. Attention and concentration normal. Speech clear. Recent memory is intact. Cranial Nerves: Visual fields grossly intact. EOMI and PERRLA. No nystagmus noted. Facial sensation intact at forehead, maxillary cheek, and chin/mandible bilaterally. No facial asymmetry or weakness. Hearing grossly normal. Uvula is midline, and palate elevates symmetrically. Normal SCM and trapezius strength. Tongue midline without fasciculations. Motor: Muscle strength 5/5 in proximal and distal UE and LE bilaterally. No pronator drift. Muscle tone normal.  Sensation: Intact to light touch in upper and lower extremities distally bilaterally.  Gait: Normal without ataxia. Coordination: Normal FTN bilaterally.          ____________________________________________   LABS (all labs ordered are listed, but only abnormal results are displayed)  Labs Reviewed  CBC WITH DIFFERENTIAL/PLATELET - Abnormal; Notable for the following components:      Result Value   WBC 11.6 (*)    Neutro Abs 9.5 (*)    All other components within normal limits  COMPREHENSIVE METABOLIC PANEL - Abnormal; Notable for the following components:   Sodium 133 (*)    Glucose, Bld 132 (*)    All other components within normal limits  URINALYSIS, COMPLETE (UACMP) WITH MICROSCOPIC - Abnormal; Notable  for  the following components:   Color, Urine YELLOW (*)    APPearance HAZY (*)    Hgb urine dipstick SMALL (*)    Ketones, ur 5 (*)    Protein, ur 30 (*)    Leukocytes,Ua TRACE (*)    All other components within normal limits  TSH  MAGNESIUM    ____________________________________________  EKG: Normal sinus rhythm, VR 73. QRS 99, QTc 477. No acute ST elevations or depressions. No ischemia or infarct. ________________________________________  RADIOLOGY All imaging, including plain films, CT scans, and ultrasounds, independently reviewed by me, and interpretations confirmed via formal radiology reads.  ED MD interpretation:   CT Head: [place]   Official radiology report(s): CT Head Wo Contrast  Result Date: 06/04/2020 CLINICAL DATA:  Onset bilateral facial numbness and dry mouth this morning. EXAM: CT HEAD WITHOUT CONTRAST TECHNIQUE: Contiguous axial images were obtained from the base of the skull through the vertex without intravenous contrast. COMPARISON:  Head CT scan 08/14/2017. FINDINGS: Brain: No evidence of acute infarction, hemorrhage, hydrocephalus, extra-axial collection or mass lesion/mass effect. Vascular: No hyperdense vessel or unexpected calcification. Skull: Intact.  No focal lesion. Sinuses/Orbits: Status post cataract surgery. Otherwise unremarkable. Other: None. IMPRESSION: Negative exam. Electronically Signed   By: Inge Rise M.D.   On: 06/04/2020 12:53    ____________________________________________  PROCEDURES   Procedure(s) performed (including Critical Care):  .1-3 Lead EKG Interpretation Performed by: Duffy Bruce, MD Authorized by: Duffy Bruce, MD     Interpretation: normal     ECG rate:  80-90   ECG rate assessment: normal     Rhythm: sinus rhythm     Ectopy: none     Conduction: normal   Comments:     Indication: Weakness    ____________________________________________  INITIAL IMPRESSION / MDM / [place] / ED  COURSE  As part of my medical decision making, I reviewed the following data within the [place] notes reviewed and incorporated, Old chart reviewed, Notes from prior ED visits, and [place] Controlled Substance Database       *Journei Thomassen was evaluated in Emergency Department on 06/04/2020 for the symptoms described in the history of present illness. She was evaluated in the context of the global COVID-19 pandemic, which necessitated consideration that the patient might be at risk for infection with the SARS-CoV-2 virus that causes COVID-19. Institutional protocols and algorithms that pertain to the evaluation of patients at risk for COVID-19 are in a state of rapid change based on information released by regulatory bodies including the CDC and federal and state organizations. These policies and algorithms were followed during the patient's care in the ED.  Some ED evaluations and interventions may be delayed as a result of limited staffing during the pandemic.*  Clinical Course as of Jun 04 1432  Mon Jun 04, 2020  1320 74 yo F with PMHx as above here with facial tingling, dry mouth in setting of multiple med changes. Suspect adverse effect to citalopram, possibly verapamil. Sx correlate directly with starting these. She has b/l facial sx and dry mouth rather than a true dysphagia/dysarthria/aphasia, and no unilateral or localizing neuro sx. CT head is negative. Doubt CVA/TIA. Labs are o/w reassuring with exception of mild likelyr eactive leukocytosis and hyponatremia from dehydration - IVF given. Will call Dr. Sabra Heck, likely plan to hold her new meds, possibly get her back on her metop, and d/c   [CI]  1432 Urinalysis shows mild ketonuria consistent with dehydration.  She has small amounts of white blood cells and red blood cells which I suspect are chronic and she denies any urinary symptoms.  She feels markedly improved with fluids.  As mentioned, her symptoms are nonfocal  and generalized and seem to correlate directly with when she started her new medication.  Will have her stop her citalopram, verapamil, restart her home metoprolol, and call her PCP.  No apparent emergent pathology at this time.  If symptoms persist, would recommend further work-up as an outpatient with possible neurology referral.   [CI]    Clinical Course User Index [CI] Duffy Bruce, MD    Medical Decision Making:  As above. D/c with outpatient follow up and good return precautions.  ____________________________________________  FINAL CLINICAL IMPRESSION(S) / ED DIAGNOSES  Final diagnoses:  Dehydration  Adverse effect of drug, initial encounter     MEDICATIONS GIVEN DURING THIS VISIT:  Medications  sodium chloride 0.9 % bolus 1,000 mL (0 mLs Intravenous Stopped 06/04/20 1425)     ED Discharge Orders    None       Note:  This document was prepared using Dragon voice recognition software and may include unintentional dictation errors.   Duffy Bruce, MD 06/04/20 1434    Duffy Bruce, MD 06/04/20 1434

## 2020-06-04 NOTE — Discharge Instructions (Addendum)
For your medications:  STOP taking the Verapamil STOP taking the Citalopram  RESTART your Metoprolol tomorrow  AVOID taking the meclizine, or any other antihistamines such as BENADRYL, NYQUIL, TYLENOL OR ADVIL PM  Drink at least 6-8 glasses of water  Follow-up with Dr. Sabra Heck

## 2020-06-04 NOTE — ED Notes (Signed)
Pt ambulatory to bathroom

## 2020-06-04 NOTE — ED Triage Notes (Signed)
Pt here from work via [employer]. Pt started taking verapamil on Friday, pt reports feeling "bad" on Saturday but starting at 10am this morning she started having bilateral facial numbness and dry mouth. Pt reports she can feel her dentures but they don't feel normal. Grip strength equal bilaterally. No slurred speech but patient does report she can't get her words out properly. Also states she feels jittery.  Pt was taking metoprolol but stopped due to intestinal issues.

## 2020-08-08 ENCOUNTER — Emergency Department: Payer: Medicare Other

## 2020-08-08 ENCOUNTER — Other Ambulatory Visit: Payer: Self-pay

## 2020-08-08 DIAGNOSIS — R519 Headache, unspecified: Secondary | ICD-10-CM | POA: Diagnosis present

## 2020-08-08 DIAGNOSIS — Z7951 Long term (current) use of inhaled steroids: Secondary | ICD-10-CM | POA: Diagnosis not present

## 2020-08-08 DIAGNOSIS — Z87891 Personal history of nicotine dependence: Secondary | ICD-10-CM | POA: Diagnosis not present

## 2020-08-08 DIAGNOSIS — G4489 Other headache syndrome: Secondary | ICD-10-CM | POA: Insufficient documentation

## 2020-08-08 DIAGNOSIS — J449 Chronic obstructive pulmonary disease, unspecified: Secondary | ICD-10-CM | POA: Diagnosis not present

## 2020-08-08 DIAGNOSIS — R110 Nausea: Secondary | ICD-10-CM | POA: Insufficient documentation

## 2020-08-08 NOTE — ED Triage Notes (Signed)
Pt in with co headache and head pressure for a week. Worse behind her eyes, states no hx of htn.

## 2020-08-09 ENCOUNTER — Emergency Department
Admission: EM | Admit: 2020-08-09 | Discharge: 2020-08-09 | Disposition: A | Discharge status: Home / Self Care | Payer: Medicare Other | Attending: Emergency Medicine | Admitting: Emergency Medicine

## 2020-08-09 DIAGNOSIS — I10 Essential (primary) hypertension: Secondary | ICD-10-CM

## 2020-08-09 DIAGNOSIS — R110 Nausea: Secondary | ICD-10-CM

## 2020-08-09 DIAGNOSIS — G4489 Other headache syndrome: Secondary | ICD-10-CM

## 2020-08-09 LAB — CBC WITH DIFFERENTIAL/PLATELET
Abs Immature Granulocytes: 0.01 10*3/uL (ref 0.00–0.07)
Basophils Absolute: 0.1 10*3/uL (ref 0.0–0.1)
Basophils Relative: 1 %
Eosinophils Absolute: 0.2 10*3/uL (ref 0.0–0.5)
Eosinophils Relative: 3 %
HCT: 38.8 % (ref 36.0–46.0)
Hemoglobin: 12.9 g/dL (ref 12.0–15.0)
Immature Granulocytes: 0 %
Lymphocytes Relative: 31 %
Lymphs Abs: 2 10*3/uL (ref 0.7–4.0)
MCH: 30.6 pg (ref 26.0–34.0)
MCHC: 33.2 g/dL (ref 30.0–36.0)
MCV: 91.9 fL (ref 80.0–100.0)
Monocytes Absolute: 0.5 10*3/uL (ref 0.1–1.0)
Monocytes Relative: 7 %
Neutro Abs: 3.7 10*3/uL (ref 1.7–7.7)
Neutrophils Relative %: 58 %
Platelets: 257 10*3/uL (ref 150–400)
RBC: 4.22 MIL/uL (ref 3.87–5.11)
RDW: 12.6 % (ref 11.5–15.5)
WBC: 6.4 10*3/uL (ref 4.0–10.5)
nRBC: 0 % (ref 0.0–0.2)

## 2020-08-09 LAB — COMPREHENSIVE METABOLIC PANEL
ALT: 62 U/L — ABNORMAL HIGH (ref 0–44)
AST: 30 U/L (ref 15–41)
Albumin: 3.8 g/dL (ref 3.5–5.0)
Alkaline Phosphatase: 83 U/L (ref 38–126)
Anion gap: 10 (ref 5–15)
BUN: 17 mg/dL (ref 8–23)
CO2: 25 mmol/L (ref 22–32)
Calcium: 8.5 mg/dL — ABNORMAL LOW (ref 8.9–10.3)
Chloride: 100 mmol/L (ref 98–111)
Creatinine, Ser: 0.57 mg/dL (ref 0.44–1.00)
GFR calc Af Amer: >60 mL/min (ref >60)
GFR calc non Af Amer: >60 mL/min (ref >60)
Glucose, Bld: 151 mg/dL — ABNORMAL HIGH (ref 70–99)
Potassium: 3.7 mmol/L (ref 3.5–5.1)
Sodium: 135 mmol/L (ref 135–145)
Total Bilirubin: 0.7 mg/dL (ref 0.3–1.2)
Total Protein: 6.8 g/dL (ref 6.5–8.1)

## 2020-08-09 LAB — URINALYSIS, COMPLETE (UACMP) WITH MICROSCOPIC
Bacteria, UA: NONE SEEN
Bilirubin Urine: NEGATIVE
Glucose, UA: NEGATIVE mg/dL
Ketones, ur: NEGATIVE mg/dL
Nitrite: NEGATIVE
Protein, ur: NEGATIVE mg/dL
Specific Gravity, Urine: 1.01 (ref 1.005–1.030)
pH: 6 (ref 5.0–8.0)

## 2020-08-09 LAB — TROPONIN I (HIGH SENSITIVITY)
Troponin I (High Sensitivity): 3 ng/L (ref <18)
Troponin I (High Sensitivity): 3 ng/L (ref <18)

## 2020-08-09 MED ORDER — ACETAMINOPHEN 500 MG PO TABS
1000.0000 mg | ORAL_TABLET | Freq: Once | ORAL | Status: AC
  Administered 2020-08-09: 1000 mg via ORAL
  Filled 2020-08-09: qty 2

## 2020-08-09 MED ORDER — LACTATED RINGERS IV BOLUS
1000.0000 mL | Freq: Once | INTRAVENOUS | Status: AC
  Administered 2020-08-09: 1000 mL via INTRAVENOUS

## 2020-08-09 MED ORDER — DROPERIDOL 2.5 MG/ML IJ SOLN
1.2500 mg | Freq: Once | INTRAMUSCULAR | Status: AC
  Administered 2020-08-09: 1.25 mg via INTRAVENOUS
  Filled 2020-08-09: qty 2

## 2020-08-09 MED ORDER — KETOROLAC TROMETHAMINE 30 MG/ML IJ SOLN
15.0000 mg | Freq: Once | INTRAMUSCULAR | Status: AC
  Administered 2020-08-09: 15 mg via INTRAVENOUS
  Filled 2020-08-09: qty 1

## 2020-08-09 NOTE — ED Provider Notes (Signed)
[facility] Emergency Department Provider Note ____________________________________________   First MD Initiated Contact with Patient 08/09/20 1349     (approximate)  I have reviewed the triage vital signs and the nursing notes.  HISTORY  Chief Complaint Headache   HPI Kimberly Page is a 74 y.o. femalewho presents to the ED for evaluation of headache.   Chart review indicates history of A. fib, GERD.  A. fib is intermittent and patient is not anticoagulated.  Patient takes metoprolol, but no proper antihypertensive.  Patient reports an aching bifrontal headache for the past 1 week that was initially intermittent, but has been constant for the past 2 days.  She reports associated nausea without vomiting, photophobia.  She reports previously headache was relieved by Tylenol, but she reported minimal improvement of her headache yesterday with Tylenol, which precipitated her presentation to the ED for evaluation.  She denies headache, neck stiffness, syncope, vision changes beyond photophobia, chest pain, shortness of breath, vomiting.  She reports tolerating p.o. intake and toileting at baseline.  Denies trauma.    Past Medical History:  Diagnosis Date  . A-fib ([place])   . Abnormal chest x-ray with multiple lung nodules 05/2019  . Arthritis    left foot  . Carpal tunnel syndrome   . Complication of anesthesia   . COPD (chronic obstructive pulmonary disease) ([place])   . Dyspnea    WITH EXERTION  . Dysrhythmia    atrial fibrillation  . Family history of adverse reaction to anesthesia    pt unsure-mom died at a young age and unsure about dad  . GERD (gastroesophageal reflux disease)   . Hemangioma of liver   . History of hiatal hernia   . Pinched nerve in shoulder, left    LEFT  . PONV (postoperative nausea and vomiting) 1958   had eye surgery 5th grade and she got sick   . Urinary incontinence   . Vertigo     Patient Active Problem List    Diagnosis Date Noted  . S/P repair of paraesophageal hernia 07/21/2019  . Pulmonary nodules/lesions, multiple 04/27/2019  . Low serum vitamin D 10/26/2017  . RAD (reactive airway disease), moderate persistent, uncomplicated 15/72/6203  . Medicare annual wellness visit, initial 04/21/2017  . Chronic pain of right lower extremity 10/22/2016  . Pernicious anemia 11/21/2015  . Hemangioma of liver 10/16/2014  . 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 Gastroesophageal reflux disease without esophagitis 10/16/2014  . PAT (paroxysmal atrial tachycardia) ([place]) 10/16/2014  . Female stress incontinence 11/11/2012  . 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 Incomplete emptying of bladder 11/11/2012  . Rectocele 11/11/2012  . Urge incontinence 11/11/2012    Past Surgical History:  Procedure Laterality Date  . ABDOMINAL HYSTERECTOMY    . BLADDER SUSPENSION  01/24/2008  . BREAST BIOPSY Left 12/2004   core- neg  . CATARACT EXTRACTION Left 06/14/2013  . CATARACT EXTRACTION Right 06/28/2013  . CHOLECYSTECTOMY  08/2008  . COLONOSCOPY    . DIAGNOSTIC LAPAROSCOPY    . ESOPHAGOGASTRODUODENOSCOPY (EGD) WITH PROPOFOL N/A 06/03/2019   Procedure: ESOPHAGOGASTRODUODENOSCOPY (EGD) WITH PROPOFOL;  Surgeon: Lollie Sails, MD;  Location: [facility] ENDOSCOPY;  Service: Endoscopy;  Laterality: N/A;  . EYE SURGERY Right 1958  . 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 Foot still partially numb.  PINS  . HEMORROIDECTOMY  11/1977  . HERNIA REPAIR  01/24/2008  . KNEE ARTHROSCOPY Right   . KNEE SURGERY Left 12/1997  .  LARYNGOSCOPY  11/2005  . reactive airway disease    . seasonal allergies    . TUBAL LIGATION    . TYMPANOSTOMY TUBE PLACEMENT  1977  . VAGINAL HYSTERECTOMY  04/1986    Prior to Admission medications   Medication Sig Start Date End Date Taking? Authorizing Provider  acetaminophen (TYLENOL) 500 MG tablet Take 500 mg by mouth every 6 (six)  hours as needed for moderate pain or headache.     [provider]  albuterol (PROVENTIL HFA;VENTOLIN HFA) 108 (90 Base) MCG/ACT inhaler Inhale 2 puffs into the lungs every 6 (six) hours as needed for wheezing or shortness of breath. 01/31/19   Laverle Hobby, MD  aspirin 81 MG tablet Take 81 mg by mouth daily.    [provider]  Calcium Carb-Cholecalciferol (CALCIUM 600+D3 PO) Take 1 tablet by mouth 2 (two) times a day.    [provider]  Cyanocobalamin (VITAMIN B-12 PO) Place 1 tablet under the tongue daily.     [provider]  Fluticasone-Salmeterol (ADVAIR) 250-50 MCG/DOSE AEPB Inhale 1 puff into the lungs 2 (two) times daily.    [provider]  loratadine (CLARITIN) 10 MG tablet Take 10 mg by mouth every morning.     [provider]  Magnesium 500 MG TABS Take 500 mg by mouth every morning.     [provider]  metoprolol succinate (TOPROL-XL) 50 MG 24 hr tablet Take 50 mg by mouth 2 (two) times daily.     [provider]  omeprazole (PRILOSEC) 40 MG capsule Take 40 mg by mouth every morning.     [provider]  oxybutynin (DITROPAN) 5 MG tablet Take 5 mg by mouth 2 (two) times daily.    [provider]  Sodium Chloride, Inhalant, (HYPER-SAL) 7 % NEBU Inhale 4 mLs into the lungs every evening.     [provider]  Tetrahydroz-Dextran-PEG-Povid (EYE DROPS ADVANCED RELIEF OP) Place 1 drop into both eyes daily as needed (for dry eyes).    [provider]  vitamin E 200 UNIT capsule Take 200 Units by mouth 4 (four) times a week. Takes Sundays, Mondays, Tuesdays and Wednesdays.    [provider]  Grant Ruts INHUB 250-50 MCG/DOSE AEPB Inhale 1 puff into the lungs 2 (two) times daily. 01/31/19   Laverle Hobby, MD    Allergies Naproxen, Propoxyphene, and Pseudoephedrine hcl  Family History  Problem Relation Age of Onset  . Hypertension Mother   . Stroke Mother   .  Leukemia Father   . Cancer Other   . Breast cancer Paternal Aunt   . Breast cancer Cousin     Social History Social History   Tobacco Use  . Smoking status: Former Smoker    Packs/day: 1.50    Years: 45.00    Pack years: 67.50    Types: Cigarettes    Quit date: 12/23/1999    Years since quitting: 20.6  . Smokeless tobacco: Never Used  Vaping Use  . Vaping Use: Never used  Substance Use Topics  . Alcohol use: Yes    Alcohol/week: 0.0 standard drinks    Comment: wine occ  . Drug use: No    Review of Systems  Constitutional: No fever/chills Eyes: No visual changes. ENT: No sore throat. Cardiovascular: Denies chest pain. Respiratory: Denies shortness of breath. Gastrointestinal: No abdominal pain.  N no vomiting.  No diarrhea.  No constipation.  Positive for nausea  genitourinary: Negative for dysuria. Musculoskeletal: Negative for back pain.  Skin: Negative for rash. Neurological: Negative for , focal weakness or numbness.  Positive for headache and photophobia.   ____________________________________________   PHYSICAL EXAM:  VITAL SIGNS: Vitals:   08/09/20 0947 08/09/20 1354  BP: (!) 203/81 (!) 182/84  Pulse: (!) 56 61  Resp: 16 16  Temp: 97.6 F (36.4 C) 98 F (36.7 C)  SpO2: 98% 97%      Constitutional: Alert and oriented. Well appearing and in no acute distress. Eyes: Conjunctivae are normal. PERRL. EOMI. Head: Atraumatic. Nose: No congestion/rhinnorhea. Mouth/Throat: Mucous membranes are moist.  Oropharynx non-erythematous. Neck: No stridor. No cervical spine tenderness to palpation. Cardiovascular: Normal rate, regular rhythm. Grossly normal heart sounds.  Good peripheral circulation. Respiratory: Normal respiratory effort.  No retractions. Lungs CTAB. Gastrointestinal: Soft , nondistended, nontender to palpation. No abdominal bruits. No CVA tenderness. Musculoskeletal: No lower extremity tenderness nor edema.  No joint effusions. No signs of acute  trauma. Neurologic:  Normal speech and language. No gross focal neurologic deficits are appreciated. No gait instability noted. Cranial nerves II through XII intact 5/5 strength and sensation in all 4 extremities Skin:  Skin is warm, dry and intact. No rash noted. Psychiatric: Mood and affect are normal. Speech and behavior are normal.  ____________________________________________   LABS (all labs ordered are listed, but only abnormal results are displayed)  Labs Reviewed  COMPREHENSIVE METABOLIC PANEL - Abnormal; Notable for the following components:      Result Value   Glucose, Bld 151 (*)    Calcium 8.5 (*)    ALT 62 (*)    All other components within normal limits  URINALYSIS, COMPLETE (UACMP) WITH MICROSCOPIC - Abnormal; Notable for the following components:   Color, Urine YELLOW (*)    APPearance CLEAR (*)    Hgb urine dipstick SMALL (*)    Leukocytes,Ua TRACE (*)    All other components within normal limits  CBC WITH DIFFERENTIAL/PLATELET  TROPONIN I (HIGH SENSITIVITY)  TROPONIN I (HIGH SENSITIVITY)   ____________________________________________  RADIOLOGY  ED MD interpretation: 2 view CXR without evidence of acute cardiopulmonary pathology. CT head reviewed and without evidence of acute intercranial pathology  Official radiology report(s): DG Chest 2 View  Result Date: 08/08/2020 CLINICAL DATA:  Hypertension, headache EXAM: CHEST - 2 VIEW COMPARISON:  Radiograph 01/14/2020, CT 11/10/2019 FINDINGS: Chronic hyperinflation with coarsened reticular and bronchitic changes throughout the lungs similar to comparison. No consolidation, features of edema, pneumothorax, or effusion. The aorta is calcified. The remaining cardiomediastinal contours are unremarkable. No acute osseous or soft tissue abnormality. Degenerative changes are present in the imaged spine and shoulders. Surgical clips in the right upper quadrant, likely prior cholecystectomy. IMPRESSION: Chronic  hyperinflation and coarsened reticular changes throughout the lungs. No acute cardiopulmonary abnormality. Aortic Atherosclerosis (ICD10-I70.0). Electronically Signed   By: Lovena Le M.D.   On: 08/08/2020 23:36   CT Head Wo Contrast  Result Date: 08/08/2020 CLINICAL DATA:  Headache and hypertension EXAM: CT HEAD WITHOUT CONTRAST TECHNIQUE: Contiguous axial images were obtained from the base of the skull through the vertex without intravenous contrast. COMPARISON:  06/04/2020 FINDINGS: Brain: There is no mass, hemorrhage or extra-axial collection. The size and configuration of the ventricles and extra-axial CSF spaces are normal. The brain parenchyma is normal, without acute or chronic infarction. Vascular: No abnormal hyperdensity of the major intracranial arteries or dural venous sinuses. No intracranial atherosclerosis. Skull: The visualized skull base, calvarium and extracranial soft tissues are normal. Sinuses/Orbits: No fluid levels or advanced mucosal thickening of the  visualized paranasal sinuses. No mastoid or middle ear effusion. The orbits are normal. IMPRESSION: Normal head CT. Electronically Signed   By: Ulyses Jarred M.D.   On: 08/08/2020 23:51    ____________________________________________   PROCEDURES and INTERVENTIONS  Procedure(s) performed (including Critical Care):  Procedures  Medications  lactated ringers bolus 1,000 mL (has no administration in time range)  acetaminophen (TYLENOL) tablet 1,000 mg (has no administration in time range)  ketorolac (TORADOL) 30 MG/ML injection 15 mg (has no administration in time range)  droperidol (INAPSINE) 2.5 MG/ML injection 1.25 mg (has no administration in time range)    ____________________________________________   INITIAL IMPRESSION / ASSESSMENT AND PLAN / ED COURSE  74 year old woman presenting with 1 week of headache most consistent with migraine, without evidence of additional acute pathology, and likely amenable to  outpatient management.  Patient persistently hypertensive with systolics in 677C-340B, vitals otherwise normal on room air.  Reassuring examination without evidence of distress, neurovascular compromise, EOM entrapment, or any other additional acute pathology.  Blood work without evidence of acute derangements.  CT head without evidence of ICH or CVA.  CXR without acute pathology.  Patient symptoms are most consistent with a common migraine.  We will attempt a migraine cocktail to assist with this.  Patient signed out to oncoming physician, Dr. Ellender Hose to follow-up on effectiveness of this intervention.  I suspect if she is feeling better she would be stable for outpatient management and discharge home.  She is well connected to PCP for follow-up within the next 5 days.  ____________________________________________   FINAL CLINICAL IMPRESSION(S) / ED DIAGNOSES  Final diagnoses:  Other headache syndrome  Nausea without vomiting  Essential hypertension     ED Discharge Orders    None       Cordarious Zeek   Note:  This document was prepared using Dragon voice recognition software and may include unintentional dictation errors.   Vladimir Crofts, MD 08/09/20 1447

## 2020-08-09 NOTE — Discharge Instructions (Addendum)
Please take Tylenol and ibuprofen/Advil for your pain.  It is safe to take them together, or to alternate them every few hours.  Take up to 1000mg  of Tylenol at a time, up to 4 times per day.  Do not take more than 4000 mg of Tylenol in 24 hours.  For ibuprofen, take 400-600 mg, 4-5 times per day.  As we discussed, please get a blood pressure cuff and check your blood pressures at home.  I would check a few times per week in a normal setting and write those numbers down to bring with your primary care physician.  Return to the ED with any worsening symptoms, especially combination with fevers or passing out.

## 2020-08-09 NOTE — ED Notes (Signed)
E-signature not working at this time. Pt verbalized understanding of D/C instructions, prescriptions and follow up care with no further questions at this time. Pt in NAD and ambulatory at time of D/C.  

## 2020-08-09 NOTE — ED Notes (Signed)
Pt reports resolvement of headache at this time. MD at bedside to reassess patient. Pt able to ambulate in hallway with steady gait as well.

## 2020-11-19 ENCOUNTER — Other Ambulatory Visit: Payer: Self-pay | Admitting: Internal Medicine

## 2020-11-19 DIAGNOSIS — Z1231 Encounter for screening mammogram for malignant neoplasm of breast: Secondary | ICD-10-CM

## 2020-12-20 ENCOUNTER — Other Ambulatory Visit: Payer: Self-pay

## 2020-12-20 ENCOUNTER — Ambulatory Visit
Admission: RE | Admit: 2020-12-20 | Discharge: 2020-12-20 | Disposition: A | Discharge status: Home / Self Care | Payer: Medicare Other | Source: Ambulatory Visit | Attending: Internal Medicine | Admitting: Internal Medicine

## 2020-12-20 DIAGNOSIS — Z1231 Encounter for screening mammogram for malignant neoplasm of breast: Secondary | ICD-10-CM | POA: Diagnosis not present

## 2021-03-13 ENCOUNTER — Other Ambulatory Visit: Payer: Self-pay | Admitting: Pulmonary Disease

## 2021-03-13 DIAGNOSIS — R918 Other nonspecific abnormal finding of lung field: Secondary | ICD-10-CM

## 2021-03-13 DIAGNOSIS — J449 Chronic obstructive pulmonary disease, unspecified: Secondary | ICD-10-CM

## 2021-03-25 ENCOUNTER — Telehealth: Payer: Self-pay | Admitting: *Deleted

## 2021-03-25 NOTE — Telephone Encounter (Addendum)
Patient returned my call and informed me that she has a CT Scan set up for 03/29/21, ordered by her pulmonologist, Dr. Claudette Stapler. Referral already in Epic for [facility].     Received referral for low dose lung cancer screening CT scan. Message left at phone number listed in EMR for patient to call me back to facilitate scheduling scan.

## 2021-03-29 ENCOUNTER — Other Ambulatory Visit: Payer: Self-pay

## 2021-03-29 ENCOUNTER — Ambulatory Visit
Admission: RE | Admit: 2021-03-29 | Discharge: 2021-03-29 | Disposition: A | Discharge status: Home / Self Care | Payer: Medicare Other | Source: Ambulatory Visit | Attending: Pulmonary Disease | Admitting: Pulmonary Disease

## 2021-03-29 DIAGNOSIS — R918 Other nonspecific abnormal finding of lung field: Secondary | ICD-10-CM | POA: Diagnosis present

## 2021-03-29 DIAGNOSIS — J449 Chronic obstructive pulmonary disease, unspecified: Secondary | ICD-10-CM | POA: Diagnosis present

## 2021-05-30 ENCOUNTER — Other Ambulatory Visit: Payer: Self-pay | Admitting: General Surgery

## 2021-06-03 LAB — SURGICAL PATHOLOGY

## 2021-09-06 IMAGING — CT CT HEAD W/O CM
4 series · 16 of 47 positions shown, 18 images · non-contrast
Comparison: 06/04/2020

CLINICAL DATA: Headache and hypertension

EXAM:
CT HEAD WITHOUT CONTRAST
TECHNIQUE: Contiguous axial images were obtained from the base of the skull
through the vertex without intravenous contrast.

[Series 2: head wo · axial · 0.41mm/px · z∈[+364,+464]mm · 7 of 28 slices shown, 9 images]
[im 4/28  brain]
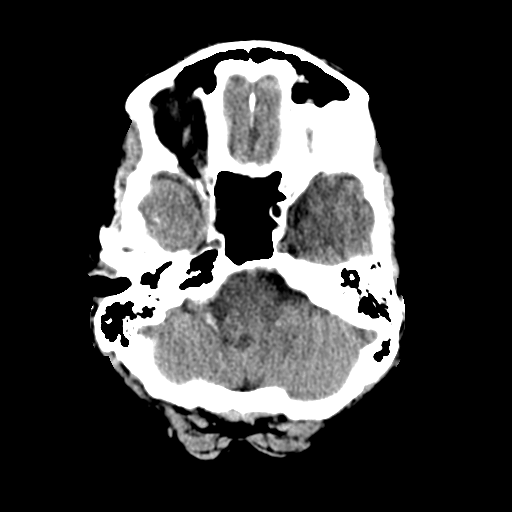
[im 4/28  bone]
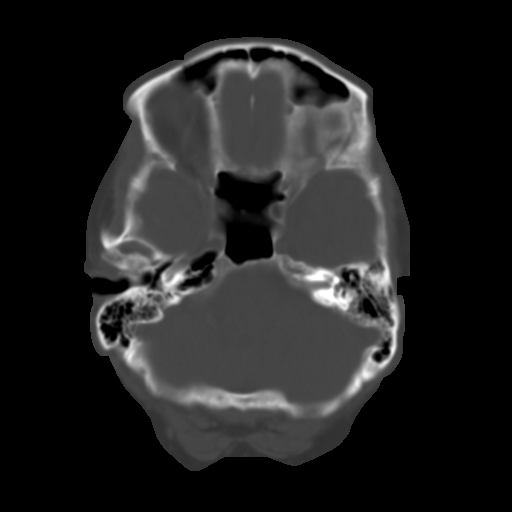
[im 7/28  brain]
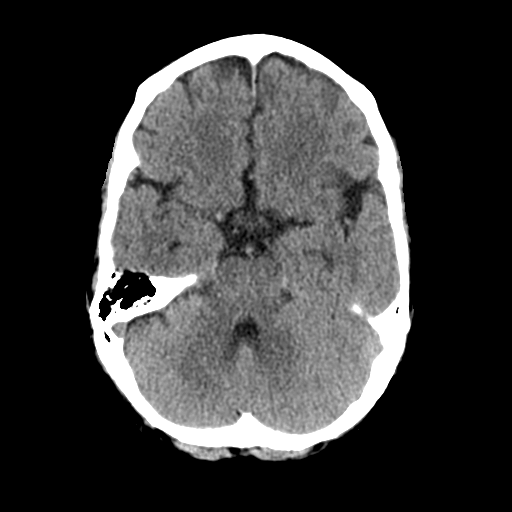
[im 11/28  brain]
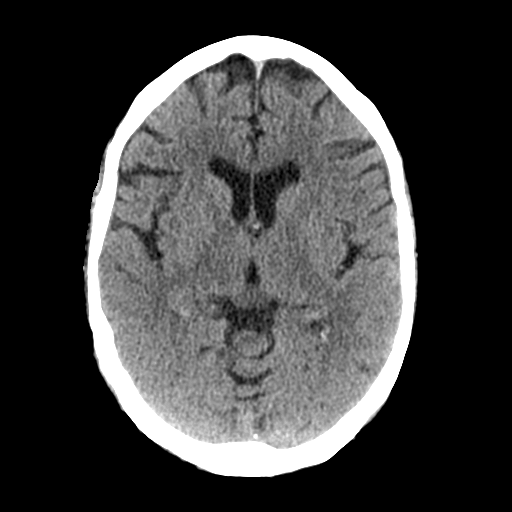
[im 14/28  brain]
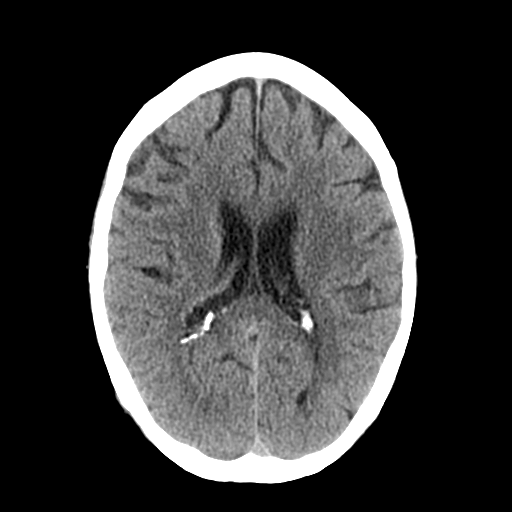
[im 17/28  brain]
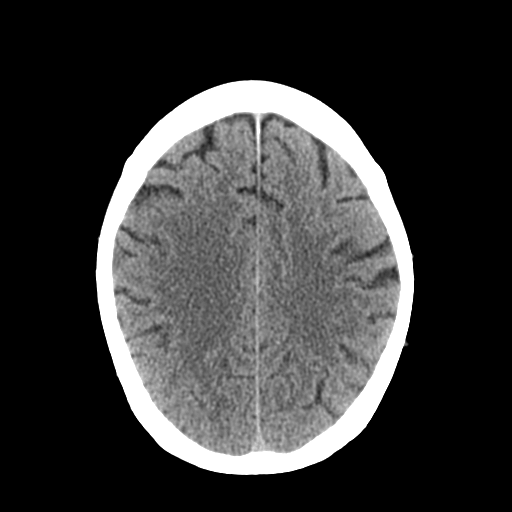
[im 17/28  bone]
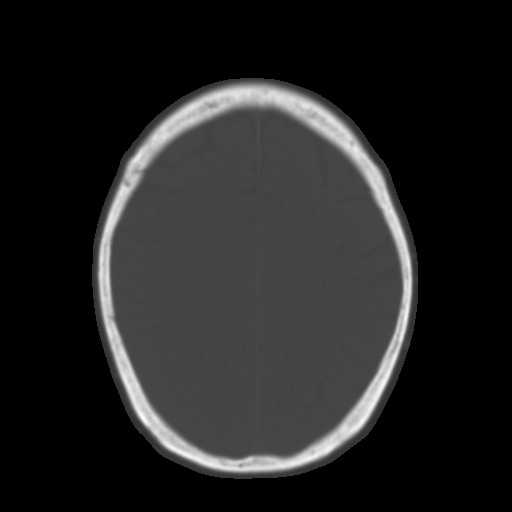
[im 21/28  brain]
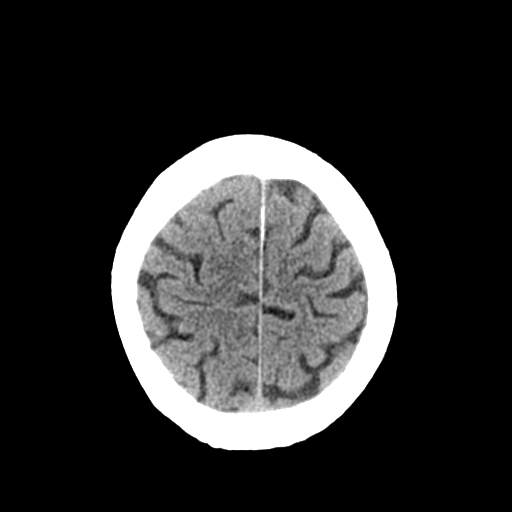
[im 24/28  brain]
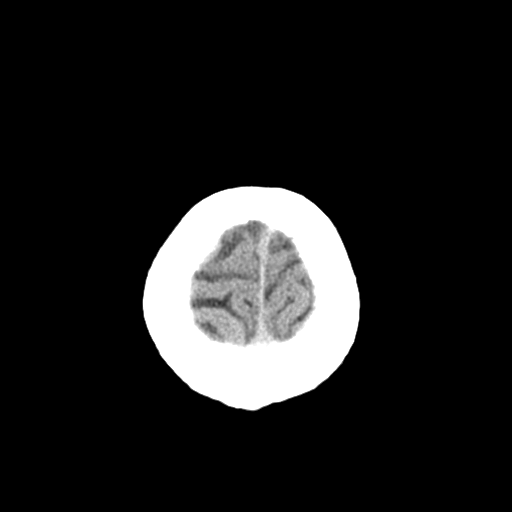

[Series 3: head bone · axial · 0.41mm/px · z∈[+361,+389]mm · 3 of 70 slices shown]
[im 7/70  bone]
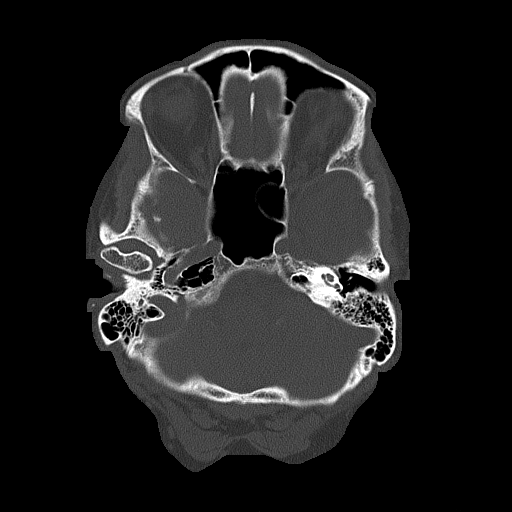
[im 14/70  bone]
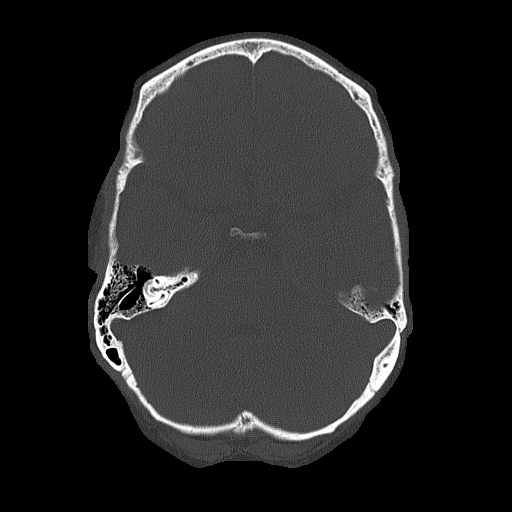
[im 21/70  bone]
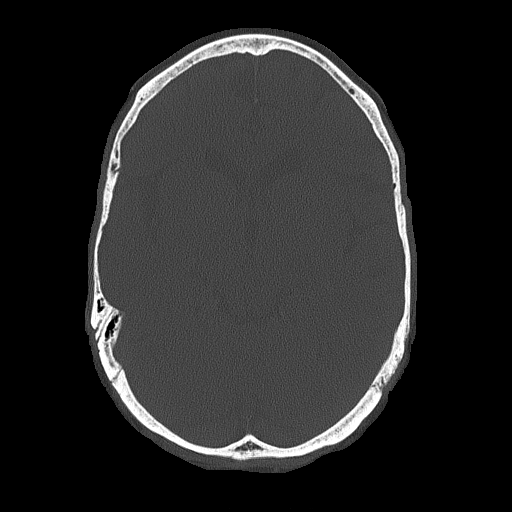

[Series 4: coronal soft tissue · coronal · 0.30mm/px · 3 of 61 slices shown]
[im 21/61  brain]
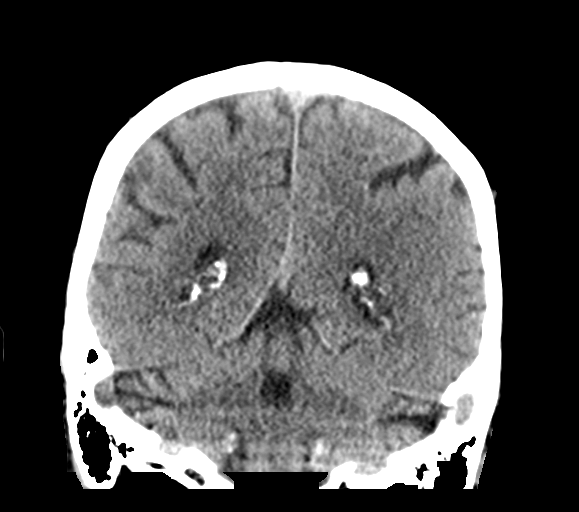
[im 27/61  brain]
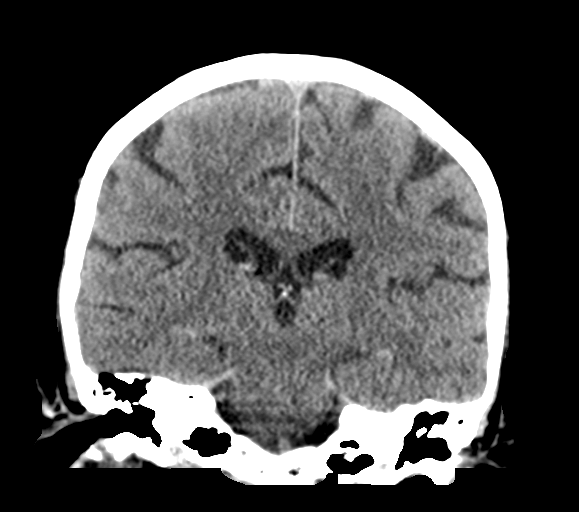
[im 34/61  brain]
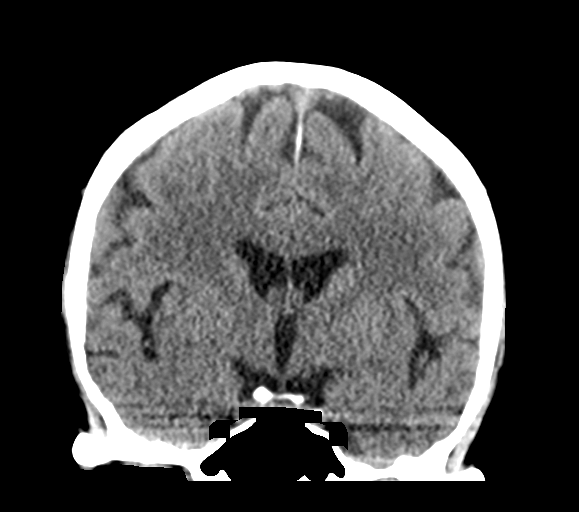

[Series 5: sagittal soft tissue · sagittal · 0.30mm/px · 3 of 48 slices shown]
[im 16/48  brain]
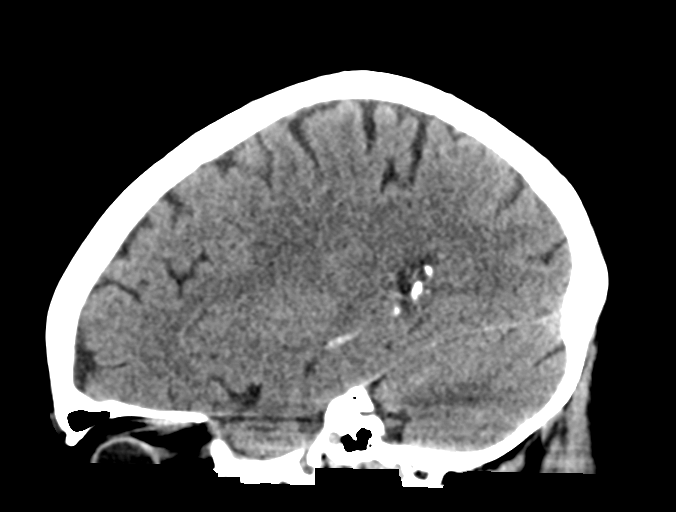
[im 24/48  brain]
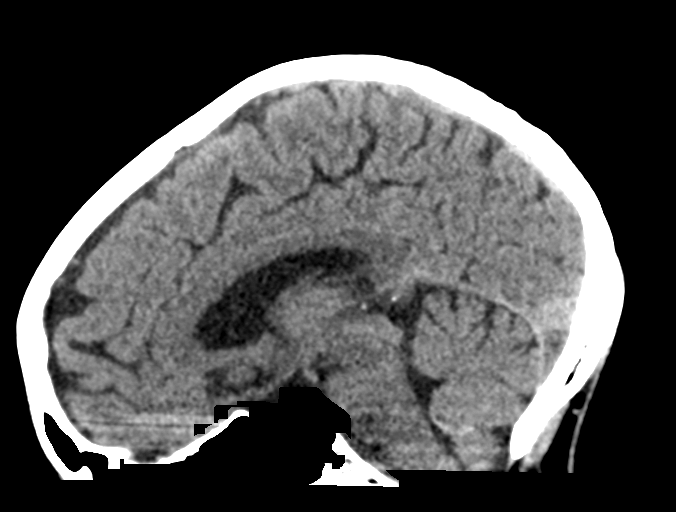
[im 32/48  brain]
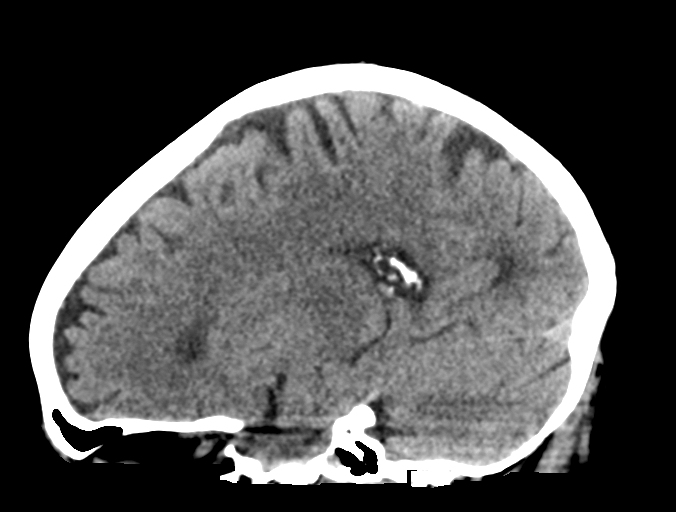

[16 of 47 positions shown; findings below may reference images not displayed]

FINDINGS: Brain: There is no mass, hemorrhage or extra-axial collection. The
size and configuration of the ventricles and extra-axial CSF spaces
are normal. The brain parenchyma is normal, without acute or chronic
infarction.

Vascular: No abnormal hyperdensity of the major intracranial
arteries or dural venous sinuses. No intracranial atherosclerosis.

Skull: The visualized skull base, calvarium and extracranial soft
tissues are normal.

Sinuses/Orbits: No fluid levels or advanced mucosal thickening of
the visualized paranasal sinuses. No mastoid or middle ear effusion.
The orbits are normal.
IMPRESSION: Normal head CT.

## 2021-11-13 ENCOUNTER — Other Ambulatory Visit: Payer: Self-pay | Admitting: Internal Medicine

## 2021-11-13 DIAGNOSIS — Z1231 Encounter for screening mammogram for malignant neoplasm of breast: Secondary | ICD-10-CM

## 2021-12-25 ENCOUNTER — Other Ambulatory Visit: Payer: Self-pay

## 2021-12-25 ENCOUNTER — Ambulatory Visit
Admission: RE | Admit: 2021-12-25 | Discharge: 2021-12-25 | Disposition: A | Discharge status: Home / Self Care | Payer: Medicare Other | Source: Ambulatory Visit | Attending: Internal Medicine | Admitting: Internal Medicine

## 2021-12-25 DIAGNOSIS — Z1231 Encounter for screening mammogram for malignant neoplasm of breast: Secondary | ICD-10-CM | POA: Diagnosis present

## 2022-01-03 ENCOUNTER — Other Ambulatory Visit: Payer: Self-pay | Admitting: Pulmonary Disease

## 2022-01-03 DIAGNOSIS — J42 Unspecified chronic bronchitis: Secondary | ICD-10-CM

## 2022-04-09 ENCOUNTER — Ambulatory Visit: Payer: Medicare Other

## 2022-04-16 ENCOUNTER — Ambulatory Visit
Admission: RE | Admit: 2022-04-16 | Discharge: 2022-04-16 | Disposition: A | Discharge status: Home / Self Care | Payer: Medicare Other | Source: Ambulatory Visit | Attending: Pulmonary Disease | Admitting: Pulmonary Disease

## 2022-04-16 DIAGNOSIS — R918 Other nonspecific abnormal finding of lung field: Secondary | ICD-10-CM | POA: Diagnosis not present

## 2022-04-16 DIAGNOSIS — I2510 Atherosclerotic heart disease of native coronary artery without angina pectoris: Secondary | ICD-10-CM | POA: Diagnosis not present

## 2022-04-16 DIAGNOSIS — K449 Diaphragmatic hernia without obstruction or gangrene: Secondary | ICD-10-CM | POA: Insufficient documentation

## 2022-04-16 DIAGNOSIS — I3139 Other pericardial effusion (noninflammatory): Secondary | ICD-10-CM | POA: Diagnosis not present

## 2022-04-16 DIAGNOSIS — N281 Cyst of kidney, acquired: Secondary | ICD-10-CM | POA: Insufficient documentation

## 2022-04-16 DIAGNOSIS — J42 Unspecified chronic bronchitis: Secondary | ICD-10-CM | POA: Diagnosis present

## 2022-06-27 ENCOUNTER — Emergency Department
Admission: EM | Admit: 2022-06-27 | Discharge: 2022-06-27 | Disposition: A | Discharge status: Home / Self Care | Payer: Medicare Other | Attending: Emergency Medicine | Admitting: Emergency Medicine

## 2022-06-27 ENCOUNTER — Emergency Department: Payer: Medicare Other

## 2022-06-27 ENCOUNTER — Other Ambulatory Visit: Payer: Self-pay

## 2022-06-27 ENCOUNTER — Encounter: Payer: Self-pay | Admitting: Emergency Medicine

## 2022-06-27 DIAGNOSIS — J449 Chronic obstructive pulmonary disease, unspecified: Secondary | ICD-10-CM | POA: Diagnosis not present

## 2022-06-27 DIAGNOSIS — R5381 Other malaise: Secondary | ICD-10-CM | POA: Diagnosis present

## 2022-06-27 DIAGNOSIS — R059 Cough, unspecified: Secondary | ICD-10-CM | POA: Insufficient documentation

## 2022-06-27 DIAGNOSIS — B349 Viral infection, unspecified: Secondary | ICD-10-CM | POA: Insufficient documentation

## 2022-06-27 DIAGNOSIS — Z20822 Contact with and (suspected) exposure to covid-19: Secondary | ICD-10-CM | POA: Insufficient documentation

## 2022-06-27 DIAGNOSIS — E860 Dehydration: Secondary | ICD-10-CM | POA: Insufficient documentation

## 2022-06-27 DIAGNOSIS — R0789 Other chest pain: Secondary | ICD-10-CM | POA: Diagnosis not present

## 2022-06-27 LAB — URINALYSIS, ROUTINE W REFLEX MICROSCOPIC
Bilirubin Urine: NEGATIVE
Glucose, UA: NEGATIVE mg/dL
Hgb urine dipstick: NEGATIVE
Ketones, ur: 20 mg/dL — AB
Leukocytes,Ua: NEGATIVE
Nitrite: NEGATIVE
Protein, ur: 100 mg/dL — AB
Specific Gravity, Urine: 1.021 (ref 1.005–1.030)
pH: 5 (ref 5.0–8.0)

## 2022-06-27 LAB — BASIC METABOLIC PANEL WITH GFR
Anion gap: 12 (ref 5–15)
BUN: 16 mg/dL (ref 8–23)
CO2: 23 mmol/L (ref 22–32)
Calcium: 8.6 mg/dL — ABNORMAL LOW (ref 8.9–10.3)
Chloride: 96 mmol/L — ABNORMAL LOW (ref 98–111)
Creatinine, Ser: 0.76 mg/dL (ref 0.44–1.00)
GFR, Estimated: >60 mL/min
Glucose, Bld: 122 mg/dL — ABNORMAL HIGH (ref 70–99)
Potassium: 4.1 mmol/L (ref 3.5–5.1)
Sodium: 131 mmol/L — ABNORMAL LOW (ref 135–145)

## 2022-06-27 LAB — HEPATIC FUNCTION PANEL
ALT: 92 U/L — ABNORMAL HIGH (ref 0–44)
AST: 43 U/L — ABNORMAL HIGH (ref 15–41)
Albumin: 3.7 g/dL (ref 3.5–5.0)
Alkaline Phosphatase: 67 U/L (ref 38–126)
Bilirubin, Direct: 0.3 mg/dL — ABNORMAL HIGH (ref 0.0–0.2)
Indirect Bilirubin: 1.5 mg/dL — ABNORMAL HIGH (ref 0.3–0.9)
Total Bilirubin: 1.8 mg/dL — ABNORMAL HIGH (ref 0.3–1.2)
Total Protein: 6.2 g/dL — ABNORMAL LOW (ref 6.5–8.1)

## 2022-06-27 LAB — CBC
HCT: 40.9 % (ref 36.0–46.0)
Hemoglobin: 13.8 g/dL (ref 12.0–15.0)
MCH: 30.2 pg (ref 26.0–34.0)
MCHC: 33.7 g/dL (ref 30.0–36.0)
MCV: 89.5 fL (ref 80.0–100.0)
Platelets: 272 10*3/uL (ref 150–400)
RBC: 4.57 MIL/uL (ref 3.87–5.11)
RDW: 12.3 % (ref 11.5–15.5)
WBC: 8.6 10*3/uL (ref 4.0–10.5)
nRBC: 0 % (ref 0.0–0.2)

## 2022-06-27 LAB — RESP PANEL BY RT-PCR (FLU A&B, COVID) ARPGX2
Influenza A by PCR: NEGATIVE
Influenza B by PCR: NEGATIVE
SARS Coronavirus 2 by RT PCR: NEGATIVE

## 2022-06-27 LAB — TROPONIN I (HIGH SENSITIVITY)
Troponin I (High Sensitivity): 10 ng/L (ref <18)
Troponin I (High Sensitivity): 33 ng/L — ABNORMAL HIGH (ref <18)

## 2022-06-27 LAB — LIPASE, BLOOD: Lipase: 24 U/L (ref 11–51)

## 2022-06-27 MED ORDER — PANTOPRAZOLE SODIUM 40 MG IV SOLR
40.0000 mg | Freq: Once | INTRAVENOUS | Status: AC
  Administered 2022-06-27: 40 mg via INTRAVENOUS
  Filled 2022-06-27: qty 10

## 2022-06-27 MED ORDER — DEXTROSE 5 % IN LACTATED RINGERS IV BOLUS
1000.0000 mL | Freq: Once | INTRAVENOUS | Status: AC
  Administered 2022-06-27: 1000 mL via INTRAVENOUS
  Filled 2022-06-27: qty 1000

## 2022-06-27 NOTE — ED Provider Notes (Signed)
[facility] Provider Note    Event Date/Time   First MD Initiated Contact with Patient 06/27/22 1301     (approximate)   History   Chief Complaint: Chest Pain   HPI  Kimberly Page is a 76 y.o. female with a history of GERD, vertigo, COPD, atrial fibrillation who comes the ED complaining of malaise and multiple other symptoms.  Reports that she was in her usual state of health until yesterday when she ate some chicken nuggets and fried cheese from [employer] for lunch.  After that she started to feel a bit queasy.  She had gradual onset of frontal headache.  Developed some mild chest discomfort and nonproductive cough.  Symptoms are intermittent, no aggravating or alleviating factors.  Denies vomiting or diarrhea, no constipation, no abdominal pain.  She has been eating and drinking okay, no dizziness.  No shortness of breath.  No exertional symptoms.     Physical Exam   Triage Vital Signs: ED Triage Vitals  Enc Vitals Group     BP 06/27/22 1210 (!) 162/98     Pulse Rate 06/27/22 1210 73     Resp 06/27/22 1210 18     Temp 06/27/22 1210 98 F (36.7 C)     Temp Source 06/27/22 1210 Oral     SpO2 06/27/22 1210 100 %     Weight 06/27/22 1211 153 lb (69.4 kg)     Height 06/27/22 1211 '5\' 4"'$  (1.626 m)     Head Circumference --      Peak Flow --      Pain Score 06/27/22 1211 3     Pain Loc --      Pain Edu? --      Excl. in [place]? --     Most recent vital signs: Vitals:   06/27/22 1402 06/27/22 1500  BP: (!) 185/85 (!) 188/75  Pulse: 69 (!) 51  Resp: (!) 28 12  Temp:    SpO2: 100% 99%    General: Awake, no distress.  CV:  Good peripheral perfusion.  Regular rate and rhythm.  Normal distal pulses Resp:  Normal effort.  Clear to auscultation bilaterally Abd:  No distention.  Soft and nontender Other:  No lower extremity edema.  Dry mucous membranes.   ED Results / Procedures / Treatments   Labs (all labs ordered are listed, but only abnormal  results are displayed) Labs Reviewed  BASIC METABOLIC PANEL - Abnormal; Notable for the following components:      Result Value   Sodium 131 (*)    Chloride 96 (*)    Glucose, Bld 122 (*)    Calcium 8.6 (*)    All other components within normal limits  URINALYSIS, ROUTINE W REFLEX MICROSCOPIC - Abnormal; Notable for the following components:   Color, Urine YELLOW (*)    APPearance HAZY (*)    Ketones, ur 20 (*)    Protein, ur 100 (*)    Bacteria, UA RARE (*)    All other components within normal limits  HEPATIC FUNCTION PANEL - Abnormal; Notable for the following components:   Total Protein 6.2 (*)    AST 43 (*)    ALT 92 (*)    Total Bilirubin 1.8 (*)    Bilirubin, Direct 0.3 (*)    Indirect Bilirubin 1.5 (*)    All other components within normal limits  TROPONIN I (HIGH SENSITIVITY) - Abnormal; Notable for the following components:   Troponin I (High Sensitivity) 33 (*)  All other components within normal limits  RESP PANEL BY RT-PCR (FLU A&B, COVID) ARPGX2  CBC  LIPASE, BLOOD  TROPONIN I (HIGH SENSITIVITY)     EKG Interpreted by me Normal sinus rhythm rate of 64.  Left axis, normal intervals.  Poor R wave progression.  Normal ST segments and T waves.  No ischemic changes.   RADIOLOGY Chest x-ray interpreted by me, negative for pneumonia or other acute findings.  Radiology report reviewed   PROCEDURES:  Procedures   MEDICATIONS ORDERED IN ED: Medications  dextrose 5% lactated ringers bolus 1,000 mL (1,000 mLs Intravenous New Bag/Given 06/27/22 1406)  pantoprazole (PROTONIX) injection 40 mg (40 mg Intravenous Given 06/27/22 1406)     IMPRESSION / MDM / ASSESSMENT AND PLAN / ED COURSE  I reviewed the triage vital signs and the nursing notes.                              Differential diagnosis includes, but is not limited to, viral illness, dehydration, electrolyte abnormality, UTI, pneumonia.  Unlikely ACS.  Doubt dissection PE or pericarditis.  Patient's  presentation is most consistent with acute complicated illness / injury requiring diagnostic workup.  Patient presents with multiple symptoms including malaise, nausea, headache, nonproductive cough, mild chest tightness that all started after eating precooked food from a grocery.  Labs are all unremarkable.  Serial troponins are unimpressive.  COVID and flu were negative.  Urinalysis does show signs of infection and inadequate oral intake.  LFTs are mildly elevated consistent with viral inflammation.  Patient is nontoxic with reassuring exam.  He is given IV fluids for hydration.  I think she does not require admission due to her clinical status and reassuring work-up and is suitable for outpatient follow-up with primary care.       FINAL CLINICAL IMPRESSION(S) / ED DIAGNOSES   Final diagnoses:  Malaise and fatigue  Dehydration  Acute viral syndrome     Rx / DC Orders   ED Discharge Orders     None        Note:  This document was prepared using Dragon voice recognition software and may include unintentional dictation errors.   Carrie Mew, MD 06/27/22 [id number]

## 2022-06-27 NOTE — Discharge Instructions (Addendum)
Your tests and evaluation in the ED were all okay.  Your labs do show some evidence of dehydration and viral infection.  Continue drinking plenty of fluids to stay hydrated.  Continue all of your home medications.

## 2022-06-27 NOTE — ED Notes (Signed)
Pt and son give verbal consent to Dc

## 2022-06-27 NOTE — ED Triage Notes (Addendum)
Patient sent to ED from [facility] for chest tightness since approx 0930. Patient c/o "head feeling fuzzy" and a cough. Patient speaking in clear sentences at this time. Patient states recent change in prescription- metoprolol (now taking '25mg'$ , was taking '50mg'$ .)

## 2022-07-13 ENCOUNTER — Other Ambulatory Visit: Payer: Self-pay

## 2022-07-13 ENCOUNTER — Emergency Department
Admission: EM | Admit: 2022-07-13 | Discharge: 2022-07-14 | Disposition: A | Discharge status: Home / Self Care | Payer: Medicare Other | Attending: Emergency Medicine | Admitting: Emergency Medicine

## 2022-07-13 DIAGNOSIS — I10 Essential (primary) hypertension: Secondary | ICD-10-CM | POA: Diagnosis not present

## 2022-07-13 DIAGNOSIS — R040 Epistaxis: Secondary | ICD-10-CM | POA: Insufficient documentation

## 2022-07-13 LAB — BASIC METABOLIC PANEL
Anion gap: 6 (ref 5–15)
BUN: 20 mg/dL (ref 8–23)
CO2: 28 mmol/L (ref 22–32)
Calcium: 8.3 mg/dL — ABNORMAL LOW (ref 8.9–10.3)
Chloride: 100 mmol/L (ref 98–111)
Creatinine, Ser: 0.66 mg/dL (ref 0.44–1.00)
GFR, Estimated: >60 mL/min (ref >60)
Glucose, Bld: 103 mg/dL — ABNORMAL HIGH (ref 70–99)
Potassium: 4.5 mmol/L (ref 3.5–5.1)
Sodium: 134 mmol/L — ABNORMAL LOW (ref 135–145)

## 2022-07-13 LAB — CBC
HCT: 42.8 % (ref 36.0–46.0)
Hemoglobin: 13.9 g/dL (ref 12.0–15.0)
MCH: 29.8 pg (ref 26.0–34.0)
MCHC: 32.5 g/dL (ref 30.0–36.0)
MCV: 91.6 fL (ref 80.0–100.0)
Platelets: 284 10*3/uL (ref 150–400)
RBC: 4.67 MIL/uL (ref 3.87–5.11)
RDW: 12.5 % (ref 11.5–15.5)
WBC: 10.5 10*3/uL (ref 4.0–10.5)
nRBC: 0 % (ref 0.0–0.2)

## 2022-07-13 LAB — PROTIME-INR
INR: 0.9 (ref 0.8–1.2)
Prothrombin Time: 12.3 seconds (ref 11.4–15.2)

## 2022-07-13 MED ORDER — METOPROLOL TARTRATE 25 MG PO TABS
25.0000 mg | ORAL_TABLET | Freq: Once | ORAL | Status: AC
  Administered 2022-07-13: 25 mg via ORAL
  Filled 2022-07-13: qty 1

## 2022-07-13 MED ORDER — CEPHALEXIN 500 MG PO CAPS: 500.0000 mg | ORAL_CAPSULE | Freq: Four times a day (QID) | ORAL | 0 refills | Status: DC

## 2022-07-13 MED ORDER — PHENYLEPHRINE HCL 0.5 % NA SOLN
2.0000 [drp] | Freq: Once | NASAL | Status: AC
  Administered 2022-07-13: 2 [drp] via NASAL
  Filled 2022-07-13: qty 15

## 2022-07-13 MED ORDER — TRANEXAMIC ACID FOR EPISTAXIS
500.0000 mg | Freq: Once | TOPICAL | Status: AC
  Administered 2022-07-13: 500 mg via TOPICAL
  Filled 2022-07-13: qty 10

## 2022-07-13 MED ORDER — CLONIDINE HCL 0.1 MG PO TABS
0.1000 mg | ORAL_TABLET | Freq: Once | ORAL | Status: AC
  Administered 2022-07-13: 0.1 mg via ORAL
  Filled 2022-07-13: qty 1

## 2022-07-13 NOTE — ED Notes (Signed)
[place] sent to the lab at this time.  

## 2022-07-13 NOTE — ED Provider Triage Note (Addendum)
Emergency Medicine Provider Triage Evaluation Note  Kimberly Page , a 76 y.o. female  was evaluated in triage.  Pt complains of epistasis left nare, x4 hours.  No trauma or injury.  She is not on blood thinners.  She used a nasal tampon and has had significant bleeding and drainage down her throat.  Bleeding currently slowed down with tampon Review of Systems  Positive: Epistasis Negative: Trauma, dizziness, lightheadedness, chest pain, shortness of breath  Physical Exam  BP (!) 202/93 (BP Location: Left Arm)   Pulse (!) 54   Temp 97.7 F (36.5 C) (Oral)   Resp 19   Ht '5\' 4"'$  (1.626 m)   Wt 67.6 kg   SpO2 97%   BMI 25.58 kg/m  Gen:   Awake, no distress   Resp:  Normal effort  MSK:   Moves extremities without difficulty  Other:  Epistasis left nare, packing removed with mild to moderate active drainage, no visible bleeding on nasal exam.  Patient does have some posterior pharyngeal drainage  Medical Decision Making  Medically screening exam initiated at 9:54 PM.  Appropriate orders placed.  Kimberly Page was informed that the remainder of the evaluation will be completed by another provider, this initial triage assessment does not replace that evaluation, and the importance of remaining in the ED until their evaluation is complete.  2 x 2 gauze soaked in Afrin nasal spray and placed into the left nare with clamp, bleeding controlled   Duanne Guess, PA-C 07/13/22 2155    Duanne Guess, PA-C 07/13/22 2156

## 2022-07-13 NOTE — ED Provider Notes (Addendum)
Medical screening examination/treatment/procedure(s) were conducted as a shared visit with non-physician practitioner(s) and myself.  I personally evaluated the patient during the encounter.     ----------------------------------------- 12:42 AM on 07/14/2022 ----------------------------------------- No further bleeding.  No oozing from the packed left nare.  Right nare clear.  No blood in the oropharynx.  Blood pressure 140/90.  Work note provided.  Keflex prophylaxis sent to her [place] at [facility] [place].  Stable for discharge.  Carrie Mew, MD     [facility] Provider Note  Patient Contact: 11:14 PM (approximate)   History   Epistaxis   HPI  Kimberly Page is a 76 y.o. female presents to the emergency department with left-sided epistaxis that started today.  Patient is also noticed associated hypertension.  No falls or mechanisms of trauma.  Patient denies chest pain, chest tightness or abdominal pain.      Physical Exam   Triage Vital Signs: ED Triage Vitals  Enc Vitals Group     BP 07/13/22 2120 (!) 202/93     Pulse Rate 07/13/22 2120 (!) 54     Resp 07/13/22 2120 19     Temp 07/13/22 2120 97.7 F (36.5 C)     Temp Source 07/13/22 2120 Oral     SpO2 07/13/22 2120 97 %     Weight 07/13/22 2120 149 lb (67.6 kg)     Height 07/13/22 2120 '5\' 4"'$  (1.626 m)     Head Circumference --      Peak Flow --      Pain Score 07/13/22 2132 2     Pain Loc --      Pain Edu? --      Excl. in [place]? --     Most recent vital signs: Vitals:   07/13/22 2228 07/13/22 2234  BP: (!) 204/76   Pulse: 69 62  Resp: 18   Temp:    SpO2: 98% 98%     General: Alert and in no acute distress. Eyes:  PERRL. EOMI. Head: No acute traumatic findings ENT:      Nose: No congestion/rhinnorhea.  Patient's left nare was actively bleeding.  Packed with Merosel and hydrated with TXA.      Mouth/Throat: Mucous membranes are moist. Neck: No stridor. No  cervical spine tenderness to palpation. Cardiovascular:  Good peripheral perfusion Respiratory: Normal respiratory effort without tachypnea or retractions. Lungs CTAB. Good air entry to the bases with no decreased or absent breath sounds. Gastrointestinal: Bowel sounds 4 quadrants. Soft and nontender to palpation. No guarding or rigidity. No palpable masses. No distention. No CVA tenderness. Musculoskeletal: Full range of motion to all extremities.  Neurologic:  No gross focal neurologic deficits are appreciated.  Skin:   No rash noted    ED Results / Procedures / Treatments   Labs (all labs ordered are listed, but only abnormal results are displayed) Labs Reviewed  BASIC METABOLIC PANEL - Abnormal; Notable for the following components:      Result Value   Sodium 134 (*)    Glucose, Bld 103 (*)    Calcium 8.3 (*)    All other components within normal limits  CBC  PROTIME-INR        PROCEDURES:  Critical Care performed: No  Procedures   MEDICATIONS ORDERED IN ED: Medications  phenylephrine (NEO-SYNEPHRINE) 0.5 % nasal solution 2 drop (2 drops Left Nare Given by Other 07/13/22 2145)  tranexamic acid (CYKLOKAPRON) 1000 MG/10ML topical solution 500 mg (500 mg Topical Given by  Other 07/13/22 2223)  cloNIDine (CATAPRES) tablet 0.1 mg (0.1 mg Oral Given 07/13/22 2222)  metoprolol tartrate (LOPRESSOR) tablet 25 mg (25 mg Oral Given 07/13/22 2249)     IMPRESSION / MDM / ASSESSMENT AND PLAN / ED COURSE  I reviewed the triage vital signs and the nursing notes.                              Assessment and plan Epistaxis 76 year old female presents to the emergency department with left-sided epistaxis.  Patient was hypertensive at triage but vital signs were otherwise reassuring.  Patient's left nare was packed with Merocel while in the emergency department and hydrated with TXA.  Patient had significant improvement in bleeding.  She was given 0.1 mg of Catapres in triage and  also given 25 mg of her nightly metoprolol.  I recommended that patient seek care with her primary care provider to address hypertension.  Patient will be discharged with Keflex 4 times daily for the next 7 days with ENT follow-up to remove Merosel.  Patient care turned over to attending Dr. Joni Fears at shift change.  Patient will be observed in the emergency department to have her blood pressure rechecked.      FINAL CLINICAL IMPRESSION(S) / ED DIAGNOSES   Final diagnoses:  Epistaxis     Rx / DC Orders   ED Discharge Orders          Ordered    cephALEXin (KEFLEX) 500 MG capsule  4 times daily        07/13/22 2314             Note:  This document was prepared using Dragon voice recognition software and may include unintentional dictation errors.   Vallarie Mare Meade, Hershal Coria 07/13/22 2318    Arta Silence, MD 07/13/22 2354    Carrie Mew, MD 07/14/22 [phone]

## 2022-07-13 NOTE — ED Triage Notes (Addendum)
Patient reports nosebleed from left nare x 4 hours. Denies taking blood thinner. Tampon in place to left nare, states blood is still coming down the back of her throat. AOX4. Ambulatory. Resp even, unlabored on RA. Speaking in full sentences.

## 2022-07-14 ENCOUNTER — Encounter: Payer: Self-pay | Admitting: Emergency Medicine

## 2022-07-14 DIAGNOSIS — R040 Epistaxis: Secondary | ICD-10-CM | POA: Insufficient documentation

## 2022-07-14 LAB — CBC
HCT: 42.3 % (ref 36.0–46.0)
Hemoglobin: 14.1 g/dL (ref 12.0–15.0)
MCH: 29.9 pg (ref 26.0–34.0)
MCHC: 33.3 g/dL (ref 30.0–36.0)
MCV: 89.6 fL (ref 80.0–100.0)
Platelets: 271 10*3/uL (ref 150–400)
RBC: 4.72 MIL/uL (ref 3.87–5.11)
RDW: 12.5 % (ref 11.5–15.5)
WBC: 15.3 10*3/uL — ABNORMAL HIGH (ref 4.0–10.5)
nRBC: 0 % (ref 0.0–0.2)

## 2022-07-14 LAB — PROTIME-INR
INR: 1 (ref 0.8–1.2)
Prothrombin Time: 12.9 seconds (ref 11.4–15.2)

## 2022-07-14 MED ORDER — CEPHALEXIN 500 MG PO CAPS: 500.0000 mg | ORAL_CAPSULE | Freq: Four times a day (QID) | ORAL | 0 refills | Status: DC

## 2022-07-14 NOTE — ED Triage Notes (Signed)
Pt seen in ED last night for continuous lefts-sided epistaxis, had TXA and Merocel packed. Pt back to ED tonight due to continuation of bleeding.

## 2022-07-15 ENCOUNTER — Emergency Department
Admission: EM | Admit: 2022-07-15 | Discharge: 2022-07-15 | Disposition: A | Discharge status: Home / Self Care | Payer: Medicare Other | Source: Home / Self Care | Attending: Emergency Medicine | Admitting: Emergency Medicine

## 2022-07-15 DIAGNOSIS — R040 Epistaxis: Secondary | ICD-10-CM

## 2022-07-15 LAB — COMPREHENSIVE METABOLIC PANEL
ALT: 28 U/L (ref 0–44)
AST: 20 U/L (ref 15–41)
Albumin: 3.8 g/dL (ref 3.5–5.0)
Alkaline Phosphatase: 68 U/L (ref 38–126)
Anion gap: 7 (ref 5–15)
BUN: 21 mg/dL (ref 8–23)
CO2: 29 mmol/L (ref 22–32)
Calcium: 8.7 mg/dL — ABNORMAL LOW (ref 8.9–10.3)
Chloride: 100 mmol/L (ref 98–111)
Creatinine, Ser: 0.85 mg/dL (ref 0.44–1.00)
GFR, Estimated: >60 mL/min (ref >60)
Glucose, Bld: 127 mg/dL — ABNORMAL HIGH (ref 70–99)
Potassium: 3.9 mmol/L (ref 3.5–5.1)
Sodium: 136 mmol/L (ref 135–145)
Total Bilirubin: 0.8 mg/dL (ref 0.3–1.2)
Total Protein: 6.4 g/dL — ABNORMAL LOW (ref 6.5–8.1)

## 2022-07-15 NOTE — ED Provider Notes (Signed)
[facility] Provider Note    Event Date/Time   First MD Initiated Contact with Patient 07/15/22 [phone]     (approximate)   History   Epistaxis   HPI  Kimberly Page is a 76 y.o. female who presents to the ED for evaluation of Epistaxis   I review ED visit from 7/23. Packed with Merocel that was hydrated with TXA on the left. Discharged home with keflex and ENT follow up.   Patient returns to the ED at the direction of ENT due to bleeding around her Merocel.  She reports having an appointment with ENT this coming Thursday, but called back because of bleeding anteriorly on the left side past her packing.  Due to this, she was advised to come to the ED.  She reports the bleeding has stopped prior to my evaluation.  When I see her, she has been waiting in the ER nearly 7 hours due to our bed hold situation in the ED with poor staffing and admit holds.  Physical Exam   Triage Vital Signs: ED Triage Vitals [07/14/22 2121]  Enc Vitals Group     BP (!) 155/96     Pulse Rate 71     Resp 16     Temp 98.4 F (36.9 C)     Temp Source Oral     SpO2 98 %     Weight      Height      Head Circumference      Peak Flow      Pain Score      Pain Loc      Pain Edu?      Excl. in [place]?     Most recent vital signs: Vitals:   07/14/22 2121 07/15/22 0157  BP: (!) 155/96 112/64  Pulse: 71 61  Resp: 16 16  Temp: 98.4 F (36.9 C) 98 F (36.7 C)  SpO2: 98% 96%    General: Awake, no distress.  CV:  Good peripheral perfusion.  Resp:  Normal effort.  Abd:  No distention.  MSK:  No deformity noted.  Neuro:  No focal deficits appreciated. Other:  Merocel sponge to the left nare in place.  No bleeding from the right nare or signs of nasal septal hematoma.  No signs of bleeding anteriorly beyond this sponge.  Good visualization of posterior oropharynx and no signs of recent bleeding, active bleeding or blood to the posterior oropharynx. She is conversational in  full sentences with a normal voice without signs of upper airway obstruction.   ED Results / Procedures / Treatments   Labs (all labs ordered are listed, but only abnormal results are displayed) Labs Reviewed  CBC - Abnormal; Notable for the following components:      Result Value   WBC 15.3 (*)    All other components within normal limits  COMPREHENSIVE METABOLIC PANEL - Abnormal; Notable for the following components:   Glucose, Bld 127 (*)    Calcium 8.7 (*)    Total Protein 6.4 (*)    All other components within normal limits  PROTIME-INR    EKG   RADIOLOGY   Official radiology report(s): No results found.  PROCEDURES and INTERVENTIONS:  Procedures  Medications - No data to display   IMPRESSION / MDM / [place] / ED COURSE  I reviewed the triage vital signs and the nursing notes.  Differential diagnosis includes, but is not limited to, posterior epistaxis, anterior epistaxis, dislodged packing, digital  trauma  76 year old woman currently with left-sided anterior packing presents with concerns for rebleeding, without evidence of such, and suitable for outpatient management with ENT follow-up.  She looks systemically well.  I see no signs of active bleeding anteriorly or posteriorly and she appears to have an appropriate Merocel sponge in place.  Normal hemoglobin, metabolic panel.  INR is normal.  I see no indications to replace her packing or cause any further trauma by trying to adjust or replace what she currently has a place that seems to be working.  We discussed careful care at home, avoiding digital trauma and following up with ENT.  Discussed appropriate return precautions and she is suitable for outpatient management.      FINAL CLINICAL IMPRESSION(S) / ED DIAGNOSES   Final diagnoses:  None     Rx / DC Orders   ED Discharge Orders     None        Note:  This document was prepared using Dragon voice recognition software and may  include unintentional dictation errors.   Vladimir Crofts, MD 07/15/22 [phone]

## 2022-07-15 NOTE — Discharge Instructions (Addendum)
Continue your antibiotics as prescribed to prevent infection while that sponge is in your nose.   Keep the Merocel sponge in place and do your best not to mess with it.   If you have any persistent bleeding down your throat or around the sponge, please return to the ED.

## 2022-07-22 ENCOUNTER — Emergency Department: Payer: Medicare Other

## 2022-07-22 ENCOUNTER — Observation Stay
Admit: 2022-07-22 | Discharge: 2022-07-22 | Disposition: A | Discharge status: Home / Self Care | Payer: Medicare Other | Attending: Cardiology | Admitting: Cardiology

## 2022-07-22 ENCOUNTER — Encounter: Payer: Self-pay | Admitting: Emergency Medicine

## 2022-07-22 ENCOUNTER — Observation Stay
Admission: EM | Admit: 2022-07-22 | Discharge: 2022-07-25 | Disposition: A | Discharge status: Home / Self Care | Payer: Medicare Other | Attending: Family Medicine | Admitting: Family Medicine

## 2022-07-22 DIAGNOSIS — E785 Hyperlipidemia, unspecified: Secondary | ICD-10-CM | POA: Diagnosis present

## 2022-07-22 DIAGNOSIS — Z87891 Personal history of nicotine dependence: Secondary | ICD-10-CM | POA: Insufficient documentation

## 2022-07-22 DIAGNOSIS — R079 Chest pain, unspecified: Secondary | ICD-10-CM | POA: Diagnosis not present

## 2022-07-22 DIAGNOSIS — R0602 Shortness of breath: Secondary | ICD-10-CM | POA: Insufficient documentation

## 2022-07-22 DIAGNOSIS — Z79899 Other long term (current) drug therapy: Secondary | ICD-10-CM | POA: Insufficient documentation

## 2022-07-22 DIAGNOSIS — E860 Dehydration: Secondary | ICD-10-CM | POA: Insufficient documentation

## 2022-07-22 DIAGNOSIS — Z20822 Contact with and (suspected) exposure to covid-19: Secondary | ICD-10-CM | POA: Insufficient documentation

## 2022-07-22 DIAGNOSIS — R778 Other specified abnormalities of plasma proteins: Secondary | ICD-10-CM | POA: Insufficient documentation

## 2022-07-22 DIAGNOSIS — R519 Headache, unspecified: Secondary | ICD-10-CM | POA: Insufficient documentation

## 2022-07-22 DIAGNOSIS — I480 Paroxysmal atrial fibrillation: Secondary | ICD-10-CM | POA: Diagnosis present

## 2022-07-22 DIAGNOSIS — E871 Hypo-osmolality and hyponatremia: Secondary | ICD-10-CM | POA: Diagnosis present

## 2022-07-22 DIAGNOSIS — R0789 Other chest pain: Secondary | ICD-10-CM

## 2022-07-22 DIAGNOSIS — R197 Diarrhea, unspecified: Secondary | ICD-10-CM | POA: Diagnosis not present

## 2022-07-22 DIAGNOSIS — Z7982 Long term (current) use of aspirin: Secondary | ICD-10-CM | POA: Diagnosis not present

## 2022-07-22 DIAGNOSIS — I214 Non-ST elevation (NSTEMI) myocardial infarction: Secondary | ICD-10-CM | POA: Diagnosis not present

## 2022-07-22 DIAGNOSIS — I10 Essential (primary) hypertension: Secondary | ICD-10-CM | POA: Diagnosis present

## 2022-07-22 DIAGNOSIS — J449 Chronic obstructive pulmonary disease, unspecified: Secondary | ICD-10-CM | POA: Diagnosis not present

## 2022-07-22 DIAGNOSIS — R072 Precordial pain: Secondary | ICD-10-CM | POA: Diagnosis present

## 2022-07-22 HISTORY — DX: Essential (primary) hypertension: I10

## 2022-07-22 LAB — CBC
HCT: 41.1 % (ref 36.0–46.0)
Hemoglobin: 14.6 g/dL (ref 12.0–15.0)
MCH: 30.3 pg (ref 26.0–34.0)
MCHC: 35.5 g/dL (ref 30.0–36.0)
MCV: 85.3 fL (ref 80.0–100.0)
Platelets: 333 10*3/uL (ref 150–400)
RBC: 4.82 MIL/uL (ref 3.87–5.11)
RDW: 11.5 % (ref 11.5–15.5)
WBC: 8.2 10*3/uL (ref 4.0–10.5)
nRBC: 0 % (ref 0.0–0.2)

## 2022-07-22 LAB — BASIC METABOLIC PANEL
Anion gap: 13 (ref 5–15)
Anion gap: 8 (ref 5–15)
Anion gap: 8 (ref 5–15)
BUN: 13 mg/dL (ref 8–23)
BUN: 10 mg/dL (ref 8–23)
BUN: 10 mg/dL (ref 8–23)
CO2: 23 mmol/L (ref 22–32)
CO2: 26 mmol/L (ref 22–32)
CO2: 25 mmol/L (ref 22–32)
Calcium: 9.6 mg/dL (ref 8.9–10.3)
Calcium: 8.5 mg/dL — ABNORMAL LOW (ref 8.9–10.3)
Calcium: 8.4 mg/dL — ABNORMAL LOW (ref 8.9–10.3)
Chloride: 87 mmol/L — ABNORMAL LOW (ref 98–111)
Chloride: 96 mmol/L — ABNORMAL LOW (ref 98–111)
Chloride: 95 mmol/L — ABNORMAL LOW (ref 98–111)
Creatinine, Ser: 0.74 mg/dL (ref 0.44–1.00)
Creatinine, Ser: 0.66 mg/dL (ref 0.44–1.00)
Creatinine, Ser: 0.8 mg/dL (ref 0.44–1.00)
GFR, Estimated: >60 mL/min (ref >60)
GFR, Estimated: >60 mL/min (ref >60)
GFR, Estimated: >60 mL/min (ref >60)
Glucose, Bld: 159 mg/dL — ABNORMAL HIGH (ref 70–99)
Glucose, Bld: 112 mg/dL — ABNORMAL HIGH (ref 70–99)
Glucose, Bld: 149 mg/dL — ABNORMAL HIGH (ref 70–99)
Potassium: 3.8 mmol/L (ref 3.5–5.1)
Potassium: 3.9 mmol/L (ref 3.5–5.1)
Potassium: 3.9 mmol/L (ref 3.5–5.1)
Sodium: 123 mmol/L — ABNORMAL LOW (ref 135–145)
Sodium: 130 mmol/L — ABNORMAL LOW (ref 135–145)
Sodium: 128 mmol/L — ABNORMAL LOW (ref 135–145)

## 2022-07-22 LAB — URINALYSIS, ROUTINE W REFLEX MICROSCOPIC
Bilirubin Urine: NEGATIVE
Glucose, UA: NEGATIVE mg/dL
Hgb urine dipstick: NEGATIVE
Ketones, ur: NEGATIVE mg/dL
Leukocytes,Ua: NEGATIVE
Nitrite: NEGATIVE
Protein, ur: NEGATIVE mg/dL
Specific Gravity, Urine: 1.004 — ABNORMAL LOW (ref 1.005–1.030)
pH: 8 (ref 5.0–8.0)

## 2022-07-22 LAB — TROPONIN I (HIGH SENSITIVITY)
Troponin I (High Sensitivity): 21 ng/L — ABNORMAL HIGH (ref <18)
Troponin I (High Sensitivity): 55 ng/L — ABNORMAL HIGH (ref <18)
Troponin I (High Sensitivity): 87 ng/L — ABNORMAL HIGH (ref <18)
Troponin I (High Sensitivity): 74 ng/L — ABNORMAL HIGH (ref <18)
Troponin I (High Sensitivity): 40 ng/L — ABNORMAL HIGH (ref <18)

## 2022-07-22 LAB — HEMOGLOBIN A1C
Hgb A1c MFr Bld: 5.4 % (ref 4.8–5.6)
Mean Plasma Glucose: 108.28 mg/dL

## 2022-07-22 LAB — PROTIME-INR
INR: 1 (ref 0.8–1.2)
Prothrombin Time: 13 seconds (ref 11.4–15.2)

## 2022-07-22 LAB — SARS CORONAVIRUS 2 BY RT PCR: SARS Coronavirus 2 by RT PCR: NEGATIVE

## 2022-07-22 LAB — SODIUM, URINE, RANDOM: Sodium, Ur: 24 mmol/L

## 2022-07-22 LAB — TSH: TSH: 2.305 u[IU]/mL (ref 0.350–4.500)

## 2022-07-22 MED ORDER — ONDANSETRON HCL 4 MG/2ML IJ SOLN: 4.0000 mg | Freq: Four times a day (QID) | INTRAMUSCULAR | Status: DC | PRN

## 2022-07-22 MED ORDER — ASPIRIN 81 MG PO CHEW
324.0000 mg | CHEWABLE_TABLET | Freq: Once | ORAL | Status: AC
  Administered 2022-07-22: 324 mg via ORAL
  Filled 2022-07-22: qty 4

## 2022-07-22 MED ORDER — VITAMIN B-12 1000 MCG PO TABS
500.0000 ug | ORAL_TABLET | Freq: Every day | ORAL | Status: DC
  Administered 2022-07-22 – 2022-07-25 (×4): 500 ug via ORAL
  Filled 2022-07-22 (×4): qty 1

## 2022-07-22 MED ORDER — MONTELUKAST SODIUM 10 MG PO TABS
10.0000 mg | ORAL_TABLET | Freq: Every day | ORAL | Status: DC
  Administered 2022-07-22 – 2022-07-25 (×3): 10 mg via ORAL
  Filled 2022-07-22 (×4): qty 1

## 2022-07-22 MED ORDER — ASPIRIN 81 MG PO TBEC: 81.0000 mg | DELAYED_RELEASE_TABLET | Freq: Every day | ORAL | Status: DC

## 2022-07-22 MED ORDER — VITAMIN E 45 MG (100 UNIT) PO CAPS
200.0000 [IU] | ORAL_CAPSULE | ORAL | Status: DC
  Administered 2022-07-22 – 2022-07-24 (×2): 200 [IU] via ORAL
  Filled 2022-07-22 (×2): qty 2

## 2022-07-22 MED ORDER — LISINOPRIL 10 MG PO TABS
10.0000 mg | ORAL_TABLET | Freq: Every day | ORAL | Status: DC
  Administered 2022-07-22 – 2022-07-25 (×3): 10 mg via ORAL
  Filled 2022-07-22 (×4): qty 1

## 2022-07-22 MED ORDER — OYSTER SHELL CALCIUM/D3 500-5 MG-MCG PO TABS
1.0000 | ORAL_TABLET | Freq: Every day | ORAL | Status: DC
  Administered 2022-07-22 – 2022-07-24 (×2): 1 via ORAL
  Filled 2022-07-22 (×2): qty 1

## 2022-07-22 MED ORDER — DM-GUAIFENESIN ER 30-600 MG PO TB12: 1.0000 | ORAL_TABLET | Freq: Two times a day (BID) | ORAL | Status: DC | PRN

## 2022-07-22 MED ORDER — LISINOPRIL-HYDROCHLOROTHIAZIDE 10-12.5 MG PO TABS: 1.0000 | ORAL_TABLET | Freq: Every day | ORAL | Status: DC

## 2022-07-22 MED ORDER — ASPIRIN 81 MG PO TBEC
81.0000 mg | DELAYED_RELEASE_TABLET | Freq: Every day | ORAL | Status: DC
  Administered 2022-07-24 – 2022-07-25 (×2): 81 mg via ORAL
  Filled 2022-07-22 (×3): qty 1

## 2022-07-22 MED ORDER — ACETAMINOPHEN 650 MG RE SUPP: 650.0000 mg | Freq: Four times a day (QID) | RECTAL | Status: DC | PRN

## 2022-07-22 MED ORDER — HYDROCHLOROTHIAZIDE 12.5 MG PO TABS: 12.5000 mg | ORAL_TABLET | Freq: Every day | ORAL | Status: DC

## 2022-07-22 MED ORDER — FLUTICASONE PROPIONATE 50 MCG/ACT NA SUSP
2.0000 | Freq: Every day | NASAL | Status: DC
  Administered 2022-07-24 – 2022-07-25 (×2): 2 via NASAL
  Filled 2022-07-22 (×2): qty 16

## 2022-07-22 MED ORDER — HYDRALAZINE HCL 20 MG/ML IJ SOLN
5.0000 mg | INTRAMUSCULAR | Status: DC | PRN
Start: 2022-07-22 — End: 2022-07-25

## 2022-07-22 MED ORDER — ONDANSETRON HCL 4 MG PO TABS: 4.0000 mg | ORAL_TABLET | Freq: Four times a day (QID) | ORAL | Status: DC | PRN

## 2022-07-22 MED ORDER — METOPROLOL SUCCINATE ER 25 MG PO TB24
37.5000 mg | ORAL_TABLET | Freq: Every day | ORAL | Status: DC
  Administered 2022-07-22 – 2022-07-24 (×3): 37.5 mg via ORAL
  Filled 2022-07-22 (×2): qty 1.5
  Filled 2022-07-22 (×2): qty 2

## 2022-07-22 MED ORDER — ENOXAPARIN SODIUM 40 MG/0.4ML IJ SOSY
40.0000 mg | PREFILLED_SYRINGE | INTRAMUSCULAR | Status: DC
Start: 2022-07-22 — End: 2022-07-25
  Administered 2022-07-22 – 2022-07-25 (×3): 40 mg via SUBCUTANEOUS
  Filled 2022-07-22 (×4): qty 0.4

## 2022-07-22 MED ORDER — MAGNESIUM OXIDE -MG SUPPLEMENT 400 (240 MG) MG PO TABS
400.0000 mg | ORAL_TABLET | ORAL | Status: DC
  Administered 2022-07-22 – 2022-07-25 (×4): 400 mg via ORAL
  Filled 2022-07-22 (×4): qty 1

## 2022-07-22 MED ORDER — OXYBUTYNIN CHLORIDE 5 MG PO TABS
5.0000 mg | ORAL_TABLET | Freq: Two times a day (BID) | ORAL | Status: DC
  Administered 2022-07-22 – 2022-07-25 (×7): 5 mg via ORAL
  Filled 2022-07-22 (×7): qty 1

## 2022-07-22 MED ORDER — SODIUM CHLORIDE 0.9 % IV BOLUS
1000.0000 mL | Freq: Once | INTRAVENOUS | Status: AC
  Administered 2022-07-22: 1000 mL via INTRAVENOUS

## 2022-07-22 MED ORDER — ACETAMINOPHEN 325 MG PO TABS: 650.0000 mg | ORAL_TABLET | Freq: Four times a day (QID) | ORAL | Status: DC | PRN

## 2022-07-22 MED ORDER — LORATADINE 10 MG PO TABS
10.0000 mg | ORAL_TABLET | ORAL | Status: DC
  Administered 2022-07-22 – 2022-07-25 (×3): 10 mg via ORAL
  Filled 2022-07-22 (×4): qty 1

## 2022-07-22 MED ORDER — MORPHINE SULFATE (PF) 2 MG/ML IV SOLN: 2.0000 mg | INTRAVENOUS | Status: DC | PRN

## 2022-07-22 MED ORDER — SODIUM CHLORIDE 0.9 % IV SOLN: INTRAVENOUS | Status: DC

## 2022-07-22 MED ORDER — NITROGLYCERIN 0.4 MG SL SUBL: 0.4000 mg | SUBLINGUAL_TABLET | SUBLINGUAL | Status: DC | PRN

## 2022-07-22 MED ORDER — MAGNESIUM HYDROXIDE 400 MG/5ML PO SUSP: 30.0000 mL | Freq: Every day | ORAL | Status: DC | PRN

## 2022-07-22 MED ORDER — ATORVASTATIN CALCIUM 20 MG PO TABS
40.0000 mg | ORAL_TABLET | Freq: Every day | ORAL | Status: DC
  Administered 2022-07-22 – 2022-07-25 (×3): 40 mg via ORAL
  Filled 2022-07-22 (×4): qty 2

## 2022-07-22 MED ORDER — ALBUTEROL SULFATE (2.5 MG/3ML) 0.083% IN NEBU: 2.5000 mg | INHALATION_SOLUTION | Freq: Four times a day (QID) | RESPIRATORY_TRACT | Status: DC | PRN

## 2022-07-22 MED ORDER — TRAZODONE HCL 50 MG PO TABS: 25.0000 mg | ORAL_TABLET | Freq: Every evening | ORAL | Status: DC | PRN

## 2022-07-22 NOTE — H&P (Signed)
History and Physical    Kimberly Page RSW:[id number] DOB: 26-Mar-1946 DOA: 07/22/2022  Referring MD/NP/PA:   PCP: Rusty Aus, MD   Patient coming from:  The patient is coming from home.    Chief Complaint: chest pain  HPI: Kimberly Page is a 76 y.o. female with medical history significant of HTN, HLD, COPD, a fib not on AC, GERD, former smoker, hiatal hernia (s/p of repair of paraesophageal hernia), who presents with chest pain.   Patient states that her chest pain started last night, which is located in the substernal area, pressure-like, moderate, nonradiating.  Associated with mild shortness of breath.  Patient has mild dry cough, no fever or chills.  She also reports palpitation.  She states that her doctor made adjustment for her metoprolol dose recently. Initially she was taking 50 mg of metoprolol twice daily, then 25 mg twice daily, then changed to 35.5 mg twice daily currently.  Her heart rate is 50-80s in ED.  Patient states that she has nausea, and diarrhea in the past several days, with 2 or 3 times of diarrhea each day.  No abdominal pain or vomiting.  Denies symptoms of UTI.  Patient also reports headache earlier.  No unilateral numbness or tinglings in extremities.  No facial droop or slurred speech.  Patient states that she had nosebleeding recently, which was cauterized by ENT.  Currently no nose bleeding.  Data reviewed independently and ED Course: pt was found to have Troponin 21 --> 55 --> 87, hyponatremia with sodium 123, WBC 8.2, negative urinalysis, GFR> 60, temperature 97.3, soft blood pressure 91/58, heart rate 58-80s, RR 18, oxygen saturation 97% on room air.  Chest x-ray negative.  CT of head is negative for acute intracranial abnormalities.  Patient is placed on telemetry bed for observation.  Dr. Nehemiah Massed of currently is consulted.  EKG: I have personally reviewed.  EKG with T wave inversion in lead I/aVL.  Sinus rhythm, QTc 474, LAE, LAD, poor R wave  progression.   Review of Systems:   General: no fevers, chills, no body weight gain, has fatigue HEENT: no blurry vision, hearing changes or sore throat Respiratory: has dyspnea, coughing, no wheezing CV: has chest pain, no palpitations GI: No vomiting, abdominal pain,  has nausea and diarrhea GU: no dysuria, burning on urination, increased urinary frequency, hematuria  Ext: has trace leg edema Neuro: no unilateral weakness, numbness, or tingling, no vision change or hearing loss Skin: no rash, no skin tear. MSK: No muscle spasm, no deformity, no limitation of range of movement in spin Heme: No easy bruising.  Travel history: No recent long distant travel.   Allergy:  Allergies  Allergen Reactions   Naproxen Other (See Comments)    passed out. 50 years ago   Citalopram     Unknown   Propoxyphene Other (See Comments)    Unknown   Verapamil     Unknown   Pseudoephedrine Hcl Palpitations    Past Medical History:  Diagnosis Date   A-fib (HCC)    Abnormal chest x-ray with multiple lung nodules 05/2019   Arthritis    left foot   Carpal tunnel syndrome    Complication of anesthesia    COPD (chronic obstructive pulmonary disease) (HCC)    Dyspnea    WITH EXERTION   Dysrhythmia    atrial fibrillation   Family history of adverse reaction to anesthesia    pt unsure-mom died at a young age and unsure about dad  GERD (gastroesophageal reflux disease)    Hemangioma of liver    History of hiatal hernia    HTN (hypertension)    Pinched nerve in shoulder, left    LEFT   PONV (postoperative nausea and vomiting) 1958   had eye surgery 5th grade and she got sick    Urinary incontinence    Vertigo     Past Surgical History:  Procedure Laterality Date   ABDOMINAL HYSTERECTOMY     BLADDER SUSPENSION  01/24/2008   BREAST BIOPSY Left 12/2004   core- neg   CATARACT EXTRACTION Left 06/14/2013   CATARACT EXTRACTION Right 06/28/2013   CHOLECYSTECTOMY  08/2008   COLONOSCOPY      DIAGNOSTIC LAPAROSCOPY     ESOPHAGOGASTRODUODENOSCOPY (EGD) WITH PROPOFOL N/A 06/03/2019   Procedure: ESOPHAGOGASTRODUODENOSCOPY (EGD) WITH PROPOFOL;  Surgeon: Lollie Sails, MD;  Location: [facility] ENDOSCOPY;  Service: Endoscopy;  Laterality: N/A;   EYE SURGERY Right 1958   FOOT SURGERY Right 09/07/2014   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 Foot still partially numb.  PINS   HEMORROIDECTOMY  11/1977   HERNIA REPAIR  01/24/2008   KNEE ARTHROSCOPY Right    KNEE SURGERY Left 12/1997   LARYNGOSCOPY  11/2005   reactive airway disease     seasonal allergies     TUBAL LIGATION     TYMPANOSTOMY TUBE PLACEMENT  1977   VAGINAL HYSTERECTOMY  04/1986    Social History:  reports that she quit smoking about 22 years ago. Her smoking use included cigarettes. She has a 67.50 pack-year smoking history. She has never used smokeless tobacco. She reports current alcohol use. She reports that she does not use drugs.  Family History:  Family History  Problem Relation Age of Onset   Hypertension Mother    Stroke Mother    Leukemia Father    Cancer Other    Breast cancer Paternal Aunt    Breast cancer Cousin      Prior to Admission medications   Medication Sig Start Date End Date Taking? Authorizing Provider  ADVAIR [facility] 115-21 MCG/ACT inhaler SMARTSIG:2 Puff(s) Via Inhaler Every 12 Hours 05/26/22  Yes [provider]  aspirin 81 MG tablet Take 81 mg by mouth daily.   Yes [provider]  Calcium Carb-Cholecalciferol (CALCIUM 600+D3 PO) Take 1 tablet by mouth 2 (two) times a day.   Yes [provider]  Cyanocobalamin (VITAMIN B-12 PO) Place 1 tablet under the tongue daily.    Yes [provider]  fluticasone (FLONASE) 50 MCG/ACT nasal spray Place 2 sprays into both nostrils daily. 04/28/22  Yes [provider]  lisinopril-hydrochlorothiazide (ZESTORETIC) 10-12.5 MG tablet Take 1 tablet by mouth daily. 07/14/22  Yes [provider]  loratadine (CLARITIN) 10 MG tablet Take 10 mg by mouth every morning.    Yes [provider]  Magnesium 500 MG TABS Take 500 mg by mouth every morning.    Yes [provider]  metoprolol succinate (TOPROL-XL) 25 MG 24 hr tablet Take 37.5 mg by mouth daily. 07/15/22  Yes [provider]  montelukast (SINGULAIR) 10 MG tablet Take 10 mg by mouth daily. 05/07/22  Yes [provider]  oxybutynin (DITROPAN) 5 MG tablet Take 5 mg by mouth 2 (two) times daily.   Yes [provider]  vitamin E 200 UNIT capsule Take 200 Units by mouth 4 (four) times a week. Takes Sundays, Mondays, Tuesdays and Wednesdays.   Yes [provider]  acetaminophen (TYLENOL) 500  MG tablet Take 500 mg by mouth every 6 (six) hours as needed for moderate pain or headache.     [provider]  albuterol (PROVENTIL HFA;VENTOLIN HFA) 108 (90 Base) MCG/ACT inhaler Inhale 2 puffs into the lungs every 6 (six) hours as needed for wheezing or shortness of breath. 01/31/19   Laverle Hobby, MD  Tetrahydroz-Dextran-PEG-Povid (EYE DROPS ADVANCED RELIEF OP) Place 1 drop into both eyes daily as needed (for dry eyes).    [provider]    Physical Exam: Vitals:   07/22/22 0700 07/22/22 0900 07/22/22 1000 07/22/22 1500  BP: (!) 91/58 (!) 152/80 (!) 149/74 107/72  Pulse: (!) 56 68 63 65  Resp: '16 18 19 15  '$ Temp: 97.9 F (36.6 C)  98 F (36.7 C) 98.2 F (36.8 C)  TempSrc: Oral  Oral Oral  SpO2: 96% 100% 100% 98%  Weight:      Height:       General: Not in acute distress HEENT:       Eyes: PERRL, EOMI, no scleral icterus.       ENT: No discharge from the ears and nose, no pharynx injection, no tonsillar enlargement.        Neck: No JVD, no bruit, no mass felt. Heme: No neck lymph node enlargement. Cardiac: S1/S2, RRR, No murmurs, No gallops or rubs. Respiratory: No rales, wheezing, rhonchi or rubs. GI: Soft, nondistended, nontender, no rebound  pain, no organomegaly, BS present. GU: No hematuria Ext:  has trace pitting leg edema bilaterally. 1+DP/PT pulse bilaterally. Musculoskeletal: No joint deformities, No joint redness or warmth, no limitation of ROM in spin. Skin: No rashes.  Neuro: Alert, oriented X3, cranial nerves II-XII grossly intact, moves all extremities normally.  Psych: Patient is not psychotic, no suicidal or hemocidal ideation.  Labs on Admission: I have personally reviewed following labs and imaging studies  CBC: Recent Labs  Lab 07/22/22 0057  WBC 8.2  HGB 14.6  HCT 41.1  MCV 85.3  PLT 915   Basic Metabolic Panel: Recent Labs  Lab 07/22/22 0057 07/22/22 0830 07/22/22 1308  NA 123* 130* 128*  K 3.8 3.9 3.9  CL 87* 96* 95*  CO2 '23 26 25  '$ GLUCOSE 159* 112* 149*  BUN '13 10 10  '$ CREATININE 0.74 0.66 0.80  CALCIUM 9.6 8.5* 8.4*   GFR: Estimated Creatinine Clearance: 57.6 mL/min (by C-G formula based on SCr of 0.8 mg/dL). Liver Function Tests: No results for input(s): "AST", "ALT", "ALKPHOS", "BILITOT", "PROT", "ALBUMIN" in the last 168 hours. No results for input(s): "LIPASE", "AMYLASE" in the last 168 hours. No results for input(s): "AMMONIA" in the last 168 hours. Coagulation Profile: Recent Labs  Lab 07/22/22 0057  INR 1.0   Cardiac Enzymes: No results for input(s): "CKTOTAL", "CKMB", "CKMBINDEX", "TROPONINI" in the last 168 hours. BNP (last 3 results) No results for input(s): "PROBNP" in the last 8760 hours. HbA1C: No results for input(s): "HGBA1C" in the last 72 hours. CBG: No results for input(s): "GLUCAP" in the last 168 hours. Lipid Profile: No results for input(s): "CHOL", "HDL", "LDLCALC", "TRIG", "CHOLHDL", "LDLDIRECT" in the last 72 hours. Thyroid Function Tests: Recent Labs    07/22/22 0057  TSH 2.305   Anemia Panel: No results for input(s): "VITAMINB12", "FOLATE", "FERRITIN", "TIBC", "IRON", "RETICCTPCT" in the last 72 hours. Urine analysis:    Component Value  Date/Time   COLORURINE STRAW (A) 07/22/2022 0305   APPEARANCEUR CLEAR (A) 07/22/2022 0305   LABSPEC 1.004 (L) 07/22/2022 0305   PHURINE  8.0 07/22/2022 0305   GLUCOSEU NEGATIVE 07/22/2022 0305   HGBUR NEGATIVE 07/22/2022 0305   BILIRUBINUR NEGATIVE 07/22/2022 0305   KETONESUR NEGATIVE 07/22/2022 0305   PROTEINUR NEGATIVE 07/22/2022 0305   NITRITE NEGATIVE 07/22/2022 0305   LEUKOCYTESUR NEGATIVE 07/22/2022 0305   Sepsis Labs: '@LABRCNTIP'$ (procalcitonin:4,lacticidven:4) ) Recent Results (from the past 240 hour(s))  SARS Coronavirus 2 by RT PCR (hospital order, performed in [facility] hospital lab) *cepheid single result test* Anterior Nasal Swab     Status: None   Collection Time: 07/22/22  1:51 AM   Specimen: Anterior Nasal Swab  Result Value Ref Range Status   SARS Coronavirus 2 by RT PCR NEGATIVE NEGATIVE Final    Comment: (NOTE) SARS-CoV-2 target nucleic acids are NOT DETECTED.  The SARS-CoV-2 RNA is generally detectable in upper and lower respiratory specimens during the acute phase of infection. The lowest concentration of SARS-CoV-2 viral copies this assay can detect is 250 copies / mL. A negative result does not preclude SARS-CoV-2 infection and should not be used as the sole basis for treatment or other patient management decisions.  A negative result may occur with improper specimen collection / handling, submission of specimen other than nasopharyngeal swab, presence of viral mutation(s) within the areas targeted by this assay, and inadequate number of viral copies (<250 copies / mL). A negative result must be combined with clinical observations, patient history, and epidemiological information.  Fact Sheet for Patients:   https://www.patel.info/  Fact Sheet for Healthcare Providers: https://hall.com/  This test is not yet approved or  cleared by the [place] FDA and has been authorized for detection and/or diagnosis of  SARS-CoV-2 by FDA under an Emergency Use Authorization (EUA).  This EUA will remain in effect (meaning this test can be used) for the duration of the COVID-19 declaration under Section 564(b)(1) of the Act, 21 U.S.C. section 360bbb-3(b)(1), unless the authorization is terminated or revoked sooner.  Performed at [facility], [address] [place], [place] [address]      Radiological Exams on Admission: CT HEAD WO CONTRAST (5MM)  Result Date: 07/22/2022 CLINICAL DATA:  Sudden onset headaches 4 hours ago EXAM: CT HEAD WITHOUT CONTRAST TECHNIQUE: Contiguous axial images were obtained from the base of the skull through the vertex without intravenous contrast. RADIATION DOSE REDUCTION: This exam was performed according to the departmental dose-optimization program which includes automated exposure control, adjustment of the mA and/or kV according to patient size and/or use of iterative reconstruction technique. COMPARISON:  08/08/2020 FINDINGS: Brain: No evidence of acute infarction, hemorrhage, hydrocephalus, extra-axial collection or mass lesion/mass effect. Vascular: No hyperdense vessel or unexpected calcification. Skull: Normal. Negative for fracture or focal lesion. Sinuses/Orbits: No acute finding. Other: None. IMPRESSION: No acute intracranial abnormality noted. Electronically Signed   By: Inez Catalina M.D.   On: 07/22/2022 01:25   DG Chest Port 1 View  Result Date: 07/22/2022 CLINICAL DATA:  Chest pain and shortness of breath for several hours EXAM: PORTABLE CHEST 1 VIEW COMPARISON:  06/27/2022 FINDINGS: Cardiac shadow is within normal limits. Aortic calcifications are noted. Lungs are well aerated bilaterally. No bony abnormality is seen. IMPRESSION: No acute abnormality noted. Electronically Signed   By: Inez Catalina M.D.   On: 07/22/2022 01:11      Assessment/Plan Principal Problem:   NSTEMI (non-ST elevated myocardial infarction) ([place]) Active Problems:   HTN  (hypertension)   HLD (hyperlipidemia)   COPD (chronic obstructive pulmonary disease) (HCC)   PAF (paroxysmal atrial fibrillation) ([place])  Hyponatremia   Diarrhea   Assessment and Plan: * NSTEMI (non-ST elevated myocardial infarction) (HCC) Troponin level 21, 55, 87, patient has chest pain.  Consulted Dr. Nehemiah Massed of cardiology  -Placed on telemetry bed for observation -Aspirin, Lipitor -As needed nitroglycerin and morphine -Trend troponin - Check A1c, FLP  HTN (hypertension) - IV hydralazine as needed -Hold HCTZ due to hyponatremia -Continue home lisinopril, metoprolol  HLD (hyperlipidemia) - Lipitor started  COPD (chronic obstructive pulmonary disease) (HCC) Stable - Bronchodilators  PAF (paroxysmal atrial fibrillation) ([place])  Patient is not taking anticoagulants. -Metoprolol  Hyponatremia Sodium 123, which improved to 130 after giving 1 L normal saline -Will stop IV normal saline -Check osmolality of plasma and urine, and urine sodium -Hold HCTZ  Diarrhea - Check C. difficile and GI pathogen panel          DVT ppx: SQ Lovenox  Code Status: Full code  Family Communication:  Yes, patient's son  by phone  Disposition Plan:  Anticipate discharge back to previous environment  Consults called:  Dr. Nehemiah Massed of currently is consulted  Admission status and Level of care: Telemetry Cardiac:    Med-surg bed for obs as inpt    progressive unit for obs   as inpt      SDU/inpation         Severity of Illness:  The appropriate patient status for this patient is OBSERVATION. Observation status is judged to be reasonable and necessary in order to provide the required intensity of service to ensure the patient's safety. The patient's presenting symptoms, physical exam findings, and initial radiographic and laboratory data in the context of their medical condition is felt to place them at decreased risk for further clinical deterioration. Furthermore, it is  anticipated that the patient will be medically stable for discharge from the hospital within 2 midnights of admission.        Date of Service 07/22/2022    Ivor Costa [facility]   If 7PM-7AM, please contact night-coverage [email or web address] 07/22/2022, 7:18 PM

## 2022-07-22 NOTE — Assessment & Plan Note (Signed)
Patient is not taking anticoagulants. -Metoprolol

## 2022-07-22 NOTE — Assessment & Plan Note (Signed)
Stable -Bronchodilators 

## 2022-07-22 NOTE — ED Triage Notes (Signed)
First RN Note: pt to ED via [facility] from home. Per EMS initially called out for heart racing, upon arrival HR in the 80's. Per EMS pt then c/o generalized chest "pressure". EMS reports pt's main complaint in ED is HA, with mild SHOB. Lung sounds clear, VSS, NSR. Per EMS pt A&O x4, ambulatory on arrival.     130/70 100% RA 80HR

## 2022-07-22 NOTE — Consult Note (Signed)
[place] NOTE       Patient ID: Kimberly Page MRN: [id number] DOB/AGE: 1946-06-02 76 y.o.  Admit date: 07/22/2022 Referring Physician Dr. Blaine Hamper Primary Physician Dr. Emily Filbert Primary Cardiologist Dr. Corky Sox / Ubaldo Glassing Reason for Consultation chest pain   HPI: Kimberly Page is a 18yoF with a PMH of paroxysmal atrial tachycardia (off AC d/t epistaxis), hyperlipidemia, atypical chest pain, COPD/reactive airway disease, history of tobacco use, who presents to [facility] ED in the early morning hours of 07/22/2022 with generalized malaise, weakness, and a headache with poor p.o. intake for several days.  Cardiology is consulted for further assistance with her chest discomfort.  The patient has been seen by her primary care and outpatient cardiologist twice within the last month, and several times in the ED for generalized malaise/fatigue and recurrent left-sided epistaxis.  At her most recent cardiology visit 7/25 her metoprolol succinate was increased back to 37.5 mg/day for help with her palpitations (was previously on 25 mg twice daily, earlier in the year became bradycardic on 50 mg twice daily). She was also recently started on lisinopril-HCTZ 10-25 mg by her PCP for better blood pressure control.  She says that she has been feeling okay last week, had her nasal packing removed with ENT on Friday after having it in place for 5 days.  On Saturday and Sunday she says she had felt somewhat nauseous with very poor appetite and p.o. intake with several episodes of diarrhea in addition to an intermittent headache.  She says on Sunday she had a couple episodes of heart racing where she checked her pulse and it was in the 160s with associated chest tightness.  Her chest pain does not radiate and is not associated with diaphoresis or any other symptoms.  She is unsure how long these episodes lasted, but notes in the past that when this occurs she lies down and can fall asleep and she  feels better.  Yesterday evening she noted her heart rate was 155 and thinks this episode lasted longer with associated chest tightness.  She denies any shortness of breath, lower extremity edema, orthopnea.  She says she has felt better since being in the ED and receiving fluids, has not had any recurrent episodes of heart racing or chest tightness and her headache has resolved.  Recent vitals are notable for a blood pressure of 143/78, heart rate in the 60s in sinus rhythm on telemetry.  SPO2 96% on room air.  Her labs are notable for significant hyponatremia with sodium 123, potassium 3.8, chloride 87, BUN/creatinine 13/0.74, GFR greater than 60.  On repeat this morning sodium 130, chloride 96, potassium 3.9.  High-sensitivity troponin slightly elevated with repeats pending 21-55-87.  Chest x-ray was negative for acute abnormality. Head CT negative for acute intracranial abnormality  Review of systems complete and found to be negative unless listed above     Past Medical History:  Diagnosis Date   A-fib ([place])    Abnormal chest x-ray with multiple lung nodules 05/2019   Arthritis    left foot   Carpal tunnel syndrome    Complication of anesthesia    COPD (chronic obstructive pulmonary disease) (HCC)    Dyspnea    WITH EXERTION   Dysrhythmia    atrial fibrillation   Family history of adverse reaction to anesthesia    pt unsure-mom died at a young age and unsure about dad   GERD (gastroesophageal reflux disease)    Hemangioma of liver  History of hiatal hernia    HTN (hypertension)    Pinched nerve in shoulder, left    LEFT   PONV (postoperative nausea and vomiting) 1958   had eye surgery 5th grade and she got sick    Urinary incontinence    Vertigo     Past Surgical History:  Procedure Laterality Date   ABDOMINAL HYSTERECTOMY     BLADDER SUSPENSION  01/24/2008   BREAST BIOPSY Left 12/2004   core- neg   CATARACT EXTRACTION Left 06/14/2013   CATARACT EXTRACTION Right  06/28/2013   CHOLECYSTECTOMY  08/2008   COLONOSCOPY     DIAGNOSTIC LAPAROSCOPY     ESOPHAGOGASTRODUODENOSCOPY (EGD) WITH PROPOFOL N/A 06/03/2019   Procedure: ESOPHAGOGASTRODUODENOSCOPY (EGD) WITH PROPOFOL;  Surgeon: Lollie Sails, MD;  Location: [facility] ENDOSCOPY;  Service: Endoscopy;  Laterality: N/A;   EYE SURGERY Right 1958   FOOT SURGERY Right 09/07/2014   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 Foot still partially numb.  PINS   HEMORROIDECTOMY  11/1977   HERNIA REPAIR  01/24/2008   KNEE ARTHROSCOPY Right    KNEE SURGERY Left 12/1997   LARYNGOSCOPY  11/2005   reactive airway disease     seasonal allergies     TUBAL LIGATION     TYMPANOSTOMY TUBE PLACEMENT  1977   VAGINAL HYSTERECTOMY  04/1986    (Not in a hospital admission)  Social History   Socioeconomic History   Marital status: Widowed    Spouse name: Not on file   Number of children: 1   Years of education: 1   Highest education level: Not on file  Occupational History   Occupation: Programme researcher, broadcasting/film/video  Tobacco Use   Smoking status: Former    Packs/day: 1.50    Years: 45.00    Total pack years: 67.50    Types: Cigarettes    Quit date: 12/23/1999    Years since quitting: 22.5   Smokeless tobacco: Never  Vaping Use   Vaping Use: Never used  Substance and Sexual Activity   Alcohol use: Yes    Alcohol/week: 0.0 standard drinks of alcohol    Comment: wine occ   Drug use: No   Sexual activity: Not on file  Other Topics Concern   Not on file  Social History Narrative   Lives alone   Caffeine use:  None   Sons' family lives next door to patient.  Will assist with post op care   Social Determinants of Health   Financial Resource Strain: Not on file  Food Insecurity: Not on file  Transportation Needs: Not on file  Physical Activity: Not on file  Stress: Not on file  Social Connections: Not on file  Intimate Partner Violence: Not on file    Family History  Problem Relation Age of  Onset   Hypertension Mother    Stroke Mother    Leukemia Father    Cancer Other    Breast cancer Paternal Aunt    Breast cancer Cousin       PHYSICAL EXAM General: Elderly Caucasian female, well nourished, in no acute distress.  Sitting upright in ED stretcher HEENT:  Normocephalic and atraumatic. Neck:  No JVD.  Lungs: Normal respiratory effort on room air. Clear bilaterally to auscultation. No wheezes, crackles, rhonchi.  Heart: HRRR . Normal S1 and S2 without gallops or murmurs. Radial & DP pulses 2+ bilaterally. Abdomen: Non-distended appearing.  Msk: Normal strength and tone for age. Extremities: Warm and well perfused. No clubbing, cyanosis.  No peripheral edema.  Neuro: Alert and oriented X 3. Psych:  Answers questions appropriately.   Labs:   Lab Results  Component Value Date   WBC 8.2 07/22/2022   HGB 14.6 07/22/2022   HCT 41.1 07/22/2022   MCV 85.3 07/22/2022   PLT 333 07/22/2022    Recent Labs  Lab 07/22/22 0057  NA 123*  K 3.8  CL 87*  CO2 23  BUN 13  CREATININE 0.74  CALCIUM 9.6  GLUCOSE 159*   Lab Results  Component Value Date   TROPONINI <0.03 08/11/2017   No results found for: "CHOL" No results found for: "HDL" No results found for: "LDLCALC" No results found for: "TRIG" No results found for: "CHOLHDL" No results found for: "LDLDIRECT"    Radiology: CT HEAD WO CONTRAST (5MM)  Result Date: 07/22/2022 CLINICAL DATA:  Sudden onset headaches 4 hours ago EXAM: CT HEAD WITHOUT CONTRAST TECHNIQUE: Contiguous axial images were obtained from the base of the skull through the vertex without intravenous contrast. RADIATION DOSE REDUCTION: This exam was performed according to the departmental dose-optimization program which includes automated exposure control, adjustment of the mA and/or kV according to patient size and/or use of iterative reconstruction technique. COMPARISON:  08/08/2020 FINDINGS: Brain: No evidence of acute infarction, hemorrhage,  hydrocephalus, extra-axial collection or mass lesion/mass effect. Vascular: No hyperdense vessel or unexpected calcification. Skull: Normal. Negative for fracture or focal lesion. Sinuses/Orbits: No acute finding. Other: None. IMPRESSION: No acute intracranial abnormality noted. Electronically Signed   By: Inez Catalina M.D.   On: 07/22/2022 01:25   DG Chest Port 1 View  Result Date: 07/22/2022 CLINICAL DATA:  Chest pain and shortness of breath for several hours EXAM: PORTABLE CHEST 1 VIEW COMPARISON:  06/27/2022 FINDINGS: Cardiac shadow is within normal limits. Aortic calcifications are noted. Lungs are well aerated bilaterally. No bony abnormality is seen. IMPRESSION: No acute abnormality noted. Electronically Signed   By: Inez Catalina M.D.   On: 07/22/2022 01:11   DG Chest 2 View  Result Date: 06/27/2022 CLINICAL DATA:  Chest pain. EXAM: CHEST - 2 VIEW COMPARISON:  08/08/2020. FINDINGS: Chronic hyperinflation. No consolidation. No visible pleural effusions pneumothorax per cardiomediastinal silhouette is unchanged. Cholecystectomy clips. IMPRESSION: Chronic hyperinflation. No evidence of acute cardiopulmonary disease. Electronically Signed   By: Margaretha Sheffield M.D.   On: 06/27/2022 12:41    12/02/2018 CTA heart with contrast  Comparison: None   Technique:  Contrast enhanced Prospective ECG gated CTA of the heart was  performed following the administration of IV contrast. If IV contrast  material had not been administered, the likelihood of detecting  abnormalities relevant to the patient's condition would have been  substantially decreased.  3-D volumetric and curved planar reformats were  likewise performed as indicated to increase the sensitivity of detecting  clinically relevant pathology.   Patient specific Information:  Blood Pressure at closest time to scan: 120/61  Heart rate during exam: 88  Heart rhythm during exam: Regular  Beta-blockers administered: Patient took 50 mg  metoprolol by mouth at time  at 0515 hours  Nitroglycerin administered: 0.8 mg.   Limitations of study if any: None.   Findings:  Coronary Arteries:  There is right dominance of the coronary arterial circulation.  The  coronary artery ostia are in normal location.   Left Main:  Mild calcified plaque at the origin without significant stenosis.   Left Anterior Descending: No significant atherosclerotic disease or  stenosis   Diagonals: D1 and D2 patent.  Left Circumflex: Minimal calcified plaque in the proximal left circumflex  without significant stenosis. Small amount of calcified plaque in the mid  LAD causing less than 25% stenosis.   Obtuse Marginals: No significant atherosclerotic largest obtuse marginal  branch without significant stenosis.   Right Coronary Artery: No significant atherosclerotic disease or stenosis.   Chamber Morphology and Function:  Mild left atrial enlargement. No left atrial thrombus. Left atrial  appendage without thrombus. Left ventricle normal in size without thrombus.   Mitral valve leaflet mildly thickened.  Logically tricuspid aortic valve. Mild thickening of the valve leaflets.     CT Chest:  Minimal atelectasis and bronchiectasis in the right middle lobe and  lingula. Bilateral pulmonary nodules, the largest nodule in the right lower  lobe (series 10, image 98) measuring 1.2 x 1.0 cm.   No pericardial effusion. Minimal atherosclerotic disease in the visualized  portion of the thoracic aorta without aneurysm. No lymphadenopathy in the  visualized portion of the mediastinum. Partially visualized moderate hiatal  hernia. No destructive osseous lesion.     Impression:  1. Normal origin and course of coronary arteries  2. Minimal scattered atherosclerotic disease with luminal narrowing under  25% . CAD RADS 1.  3. Bilateral pulmonary nodules in the partially visualized lung, the  largest in the right lower lobe measuring average  diameter of 1.1 cm.  Recommend follow-up CT chest in 3 months or PET/CT.       CAD-RADS Reporting and Data System for patients presenting with stable  chest pain (vessels greater than 1.5 mm diameter)  Category                 Degree of maximal coronary stenosis                       Interpretation  CAD-RADS 0               0 (no plaque or stenosis)                                 No CAD present  CAD-RADS 1               1-24% (minimal stenosis or plaque with no  stenosis)       Minimal non-obstructive CAD  CAD-RADS 2               25-49% mild luminal stenosis                               Mild non-obstructive CAD  CAD-RADS 3               50-69% moderate luminal stenosis                           Moderate Stenosis  CAD-RADS 4A              70-99% severe luminal stenosis                             Severe Stenosis  CAD-RADS 4B              left main > 50% or 3 vessel obstructive (>70%  disease     Severe Stenosis  CAD-RADS 5  100% (total occlusion)                                     Total coronary occlusion  CAD-RADS N               non-diagnostic                                             Obstructive CAD can not be excluded   Modifiers:  N: non-diagnostic  S: stent  G: graft  V: vulnerable   Electronically Reviewed by:  Tyrell Antonio, MD, [facility]  Electronically Reviewed on:  12/02/2018 12:12 PM   I have reviewed the images and concur with the above findings.   Electronically Signed by:  Burns Spain, MD, [facility]  Electronically Signed on:  12/02/2018 12:15 PM  11/11/2018 Impression  Lexiscan stress.  LV function normal.  Small area of mild anterior  hypoperfusion with stress with partial redistribution with rest.  Moderate  risk study.  Clinical correlation recommended. Narrative  CARDIOLOGY DEPARTMENT  [facility]  [facility]  [place], [place], [place]  [id number]  [phone]   Procedure: Pharmacologic  Myocardial Perfusion Imaging    ONE day procedure   Indication: Chest pain at rest, unspecified  Plan: NM myocardial perfusion SPECT multiple (stress        and rest), ECG stress test only   Ordering Physician:   Dr. Bartholome Bill    Clinical History:  76 y.o. year old female  Vitals: Height: 34 in Weight: 179 lb  Cardiac risk factors include:     PAT, COPD and Hyperlipidemia    Procedure:   Pharmacologic stress testing was performed with Regadenoson using a single  use 0.'4mg'$ /34m (0.08 mg/ml) prefilled syringe intravenously infused as a  bolus dose. The stress test was stopped due to Infusion completion.  Blood  pressure response was normal. The patient did not develop any symptoms  other than fatigue during the procedure.   Rest HR: 55bpm  Rest BP: 116/784mg  Max HR: 82bpm  Min BP: 116/7269m   Stress Test Administered by: DINOswald HillockMA   ECG Interpretation:  Rest ECG:  normal sinus rhythm, none  Stress ECG:  normal sinus rhythm, no arrhythmia or ischemia  Recovery ECG:  normal sinus rhythm  ECG Interpretation:  non-diagnostic due to pharmacologic testing.    Administrations This Visit     regadenoson (LEXISCAN) 0.4 mg/5 mL inj syringe 0.4 mg     Admin Date  11/10/2018 Action  Given Dose  0.4 mg Route  Intravenous Administered By  GoaHerbert SetaNMT     technetium [id number] (CARDIOLITE) injection 12.139.0licurie     Admin Date  11/10/2018 Action  Given Dose  12.130.0licurie Route  Intravenous Administered By  GoarHerbert SetaMT   Gated post-stress perfusion imaging was performed 30 minutes after stress.  Rest images were performed 30 minutes after injection.   Gated LV Analysis:  TID Ratio: 1.07   LVEF= 64%   FINDINGS:  Regional wall motion:  reveals normal myocardial thickening and wall  motion.  The overall quality of the study is good.    Artifacts noted: no  Left ventricular cavity: normal.   Perfusion Analysis:  SPECT  images  demonstrate small perfusion abnormality  of mild intensity is present in the anterior region on the stress images.    ECHO 09/17/2017  STRESS ECHOCARDIOGRAPHY            Protocol:Treadmill ([place])              Drugs:None  Target Heart Rate:128 bpm        Maximum Predicted Heart Rate: 150 bpm   +-------------------+-------------------------+-------------------------+------------+         Stage            Duration (mm:ss)         Heart Rate (bpm)         BP       +-------------------+-------------------------+-------------------------+------------+        RESTING                                           55              130/73     +-------------------+-------------------------+-------------------------+------------+        EXERCISE                3:56                     136                /        +-------------------+-------------------------+-------------------------+------------+        RECOVERY                4:43                      73              147/76     +-------------------+-------------------------+-------------------------+------------+     Stress Duration:3:56 mm:ss    Max Stress H.R.:136 bpm        Target Heart Rate Achieved: Yes   ___________________________________________________________________________________________   WALL SEGMENT CHANGES                         Rest          Stress  Anterior Septum Basal:Normal        Hyperkinetic                    GLO:VFIEPP        Hyperkinetic                 Apical:Normal        Hyperkinetic     Anterior Wall Basal:Normal        Hyperkinetic                    IRJ:JOACZY        Hyperkinetic                 Apical:Normal        Hyperkinetic      Lateral Wall Basal:Normal        Hyperkinetic                    SAY:TKZSWF        Hyperkinetic                 Apical:Normal        Hyperkinetic  Posterior Wall Basal:Normal        Hyperkinetic                    HYW:VPXTGG        Hyperkinetic      Inferior Wall Basal:Normal        Hyperkinetic                    YIR:SWNIOE        Hyperkinetic                 Apical:Normal        Hyperkinetic   Inferior Septum Basal:Normal        Hyperkinetic                    VOJ:JKKXFG        Hyperkinetic              Resting EF:>55% (Est.)   Stress EF: >55% (Est.)   ___________________________________________________________________________________________   ADDITIONAL FINDINGS    ___________________________________________________________________________________________   STRESS ECG RESULTS                                                ECG Results:Normal   ___________________________________________________________________________________________  ECHOCARDIOGRAPHIC DESCRIPTIONS   LEFT VENTRICLE         Size:Normal  Contraction:Normal    LV Masses:No Masses          HWE:XHBZ   RIGHT VENTRICLE         Size:Normal                Free Wall:Normal  Contraction:Normal                RV Masses:No mass   PERICARDIUM        Fluid:No effusion    _______________________________________________________________________________________  DOPPLER ECHO and OTHER SPECIAL PROCEDURES     Aortic:No AR                      No AS      Mitral:TRIVIAL MR                 No MS            MV Inflow E Vel=nm*        MV Annulus E'Vel=nm*            E/E'Ratio=nm*   Tricuspid:TRIVIAL TR                 No TS            190.0 cm/sec peak TR vel   19.4 mmHg peak RV pressure   Pulmonary:No PR                      No PS     ___________________________________________________________________________________________  ECHOCARDIOGRAPHIC MEASUREMENTS  2D DIMENSIONS  AORTA             Values      Normal Range      MAIN PA          Values      Normal Range            Annulus:  nm*       [2.1 - 2.5]  PA Main:  nm*       [1.5 - 2.1]          Aorta Sin:  nm*       [2.7 - 3.3]       RIGHT VENTRICLE        ST Junction:  nm*       [2.3 -  2.9]                RV Base:  nm*       [ < 4.2]          Asc.Aorta:  nm*       [2.3 - 3.1]                 RV Mid:  nm*       [ < 3.5]   LEFT VENTRICLE                                        RV Length:  nm*       [ < 8.6]              LVIDd:  nm*       [3.9 - 5.3]       INFERIOR VENA CAVA              LVIDs:  nm*                                 Max. IVC:  nm*       [ <= 2.1]                 FS:  nm*       [> 25]                    Min. IVC:  nm*                SWT:  nm*       [0.5 - 0.9]                   ------------------                PWT:  nm*       [0.5 - 0.9]                   nm* - not measured   LEFT ATRIUM            LA Diam:  nm*       [2.7 - 3.8]        LA A4C Area:  nm*       [ < 20]          LA Volume:  nm*       [22 - 52]    ___________________________________________________________________________________________  INTERPRETATION  Normal Stress Echocardiogram  NORMAL RIGHT VENTRICULAR SYSTOLIC FUNCTION  TRIVIAL REGURGITATION NOTED (See above)  NO VALVULAR STENOSIS NOTED  Resting EF: >55% (Est.)  Post Stress EF:>55% (Est.)  ECG Results: Normal  Mitral: TRIVIAL MR  Tricuspid: TRIVIAL TR   TELEMETRY reviewed by me: Sinus bradycardia with rate in the high 50s to sinus rhythm with rate in the 60s to 70s.  EKG reviewed by me: sinus rhythm rate 92 with TWI in aVL without other ischemic changes  ASSESSMENT AND PLAN:  Kimberly Page is a 73yoF with a PMH of paroxysmal atrial tachycardia (off AC d/t epistaxis), hyperlipidemia, atypical chest pain, COPD/reactive airway disease, history of tobacco use, who presents to [facility] ED in the early morning hours of 07/22/2022 with generalized malaise, weakness, and a headache with poor p.o. intake for several days.  Cardiology is consulted for further assistance with her chest discomfort.  #Dehydration #Palpitations with history of paroxysmal atrial tachycardia #Chest tightness #Elevated troponin The patient presents with 2 to 3  days of poor p.o. intake and nausea and several brief episodes of heart racing and associated chest tightness, was found to be fairly hyponatremic sodium 123, and was recently started on lisinopril-HCTZ 12.5 approximately 10 days ago by her PCP for better blood pressure control.  She has had ongoing issues with episodes of heart racing over the past month, in the setting of GI illness/food poisoning and recently had her metoprolol increased to 37.5 mg twice daily which had seemed to help her symptoms up until 2 to 3 days ago.  Her EKG shows sinus rhythm with a TWI in aVL without other ischemic changes and her troponin is slightly elevated trending 21-55-87 with a fourth repeat pending.  At my time of evaluation the patient feels better, denies further episodes of heart racing or chest tightness since she has been in the ER. She has been in sinus rhythm in the 60s-70s on telemetry. I suspect that her heart racing is primarily in the setting of dehydration and starting the thiazide diuretic contributing to her hyponatremia, troponin elevation is likely demand in the setting. -Agree with current therapy with IV fluid resuscitation -trend troponin until peak  -S/p 324 mg aspirin, continue 81 mg aspirin daily -Continue atorvastatin 40 mg daily -Continue metoprolol XL 37.5 mg twice daily -Continue lisinopril 10 mg, discontinue HCTZ  -Ordered echocardiogram complete, of note the patient had a CTA chest in 2019 that showed normal coronary arteries. -likely conservative management from a cardiac standpoint.  This patient's plan of care was discussed and created with Dr. Nehemiah Massed and he is in agreement.  Signed: Tristan Schroeder , PA-C 07/22/2022, 9:06 AM [facility]

## 2022-07-22 NOTE — ED Triage Notes (Signed)
Pt presents via POV with complaints of CP/SOB that started around 4 hours ago. She notes her PCP changed the dose of her metoprolol last week and she has been "feeling my heart racing" since. She notes developing a headache which is her main complaint this evening. Denies falls, dizziness, LOC, or N/V.

## 2022-07-22 NOTE — Assessment & Plan Note (Signed)
Sodium 123, which improved to 130 after giving 1 L normal saline -Will stop IV normal saline -Check osmolality of plasma and urine, and urine sodium -Hold HCTZ

## 2022-07-22 NOTE — Assessment & Plan Note (Signed)
-   Lipitor started

## 2022-07-22 NOTE — ED Provider Notes (Signed)
[facility] Provider Note    Event Date/Time   First MD Initiated Contact with Patient 07/22/22 0130     (approximate)   History   Chest Pain and Shortness of Breath   HPI  Kimberly Page is a 76 y.o. female with a history of GERD, A-fib not on anticoagulation presents to the ER for evaluation of generalized malaise for the past several days weakness and headache.  Has had some mild shortness of breath.  Has had very poor p.o. intake.  No vomiting.  Denies any abdominal pain.  States that she does just not have appetite to eat anything.  No recent medication changes.     Physical Exam   Triage Vital Signs: ED Triage Vitals  Enc Vitals Group     BP 07/22/22 0056 118/76     Pulse Rate 07/22/22 0056 87     Resp 07/22/22 0056 18     Temp 07/22/22 0056 97.7 F (36.5 C)     Temp Source 07/22/22 0056 Oral     SpO2 07/22/22 0056 100 %     Weight 07/22/22 0051 149 lb 14.6 oz (68 kg)     Height 07/22/22 0051 '5\' 4"'$  (1.626 m)     Head Circumference --      Peak Flow --      Pain Score 07/22/22 0055 6     Pain Loc --      Pain Edu? --      Excl. in [place]? --     Most recent vital signs: Vitals:   07/22/22 0230 07/22/22 0300  BP: 126/66 122/64  Pulse: 62 65  Resp: 15 17  Temp:    SpO2: 99% 98%     Constitutional: Alert  Eyes: Conjunctivae are normal.  Head: Atraumatic. Nose: No congestion/rhinnorhea. Mouth/Throat: Mucous membranes are moist.   Neck: Painless ROM.  Cardiovascular:   Good peripheral circulation. Respiratory: Normal respiratory effort.  No retractions.  Gastrointestinal: Soft and nontender.  Musculoskeletal:  no deformity Neurologic:  MAE spontaneously. No gross focal neurologic deficits are appreciated.  Skin:  Skin is warm, dry and intact. No rash noted. Psychiatric: Mood and affect are normal. Speech and behavior are normal.    ED Results / Procedures / Treatments   Labs (all labs ordered are listed, but only abnormal  results are displayed) Labs Reviewed  BASIC METABOLIC PANEL - Abnormal; Notable for the following components:      Result Value   Sodium 123 (*)    Chloride 87 (*)    Glucose, Bld 159 (*)    All other components within normal limits  TROPONIN I (HIGH SENSITIVITY) - Abnormal; Notable for the following components:   Troponin I (High Sensitivity) 21 (*)    All other components within normal limits  SARS CORONAVIRUS 2 BY RT PCR  CBC  URINALYSIS, ROUTINE W REFLEX MICROSCOPIC  SODIUM, URINE, RANDOM  TROPONIN I (HIGH SENSITIVITY)     EKG  ED ECG REPORT I, Merlyn Lot, the attending physician, personally viewed and interpreted this ECG.   Date: 07/22/2022  EKG Time: 0:53  Rate: 90  Rhythm: sinus  Axis: left  Intervals:noraml qt  ST&T Change: nonspecific st abn, no stemi    RADIOLOGY Please see ED Course for my review and interpretation.  I personally reviewed all radiographic images ordered to evaluate for the above acute complaints and reviewed radiology reports and findings.  These findings were personally discussed with the patient.  Please see  medical record for radiology report.    PROCEDURES:  Critical Care performed: Yes, see critical care procedure note(s)  .Critical Care  Performed by: Merlyn Lot, MD Authorized by: Merlyn Lot, MD   Critical care provider statement:    Critical care time (minutes):  35   Critical care was necessary to treat or prevent imminent or life-threatening deterioration of the following conditions:  Metabolic crisis   Critical care was time spent personally by me on the following activities:  Ordering and performing treatments and interventions, ordering and review of laboratory studies, ordering and review of radiographic studies, pulse oximetry, re-evaluation of patient's condition, review of old charts, obtaining history from patient or surrogate, examination of patient, evaluation of patient's response to treatment,  discussions with primary provider, discussions with consultants and development of treatment plan with patient or surrogate    MEDICATIONS ORDERED IN ED: Medications  sodium chloride 0.9 % bolus 1,000 mL (0 mLs Intravenous Stopped 07/22/22 0301)     IMPRESSION / MDM / [place] / ED COURSE  I reviewed the triage vital signs and the nursing notes.                              Differential diagnosis includes, but is not limited to, dehydration, electrolyte abnormality, ACS, pneumonia, ICH, IPH, COVID, viral illness  This patient presented to the ER for evaluation of symptoms as described above.  Given her age and risk factors  This presenting complaint could reflect a potentially life-threatening illness therefore the patient will be placed on continuous pulse oximetry and telemetry for monitoring.  Laboratory evaluation will be sent to evaluate for the above complaints.  CT imaging ordered at triage for the above complaints.  Chest x-ray ordered.   Clinical Course as of 07/22/22 0302  Tue Jul 22, 2022  0130 Chest x-ray on my review and interpretation without evidence of consolidation or pneumothorax [PR]  0254 Patient does have evidence of acute hyponatremia may be etiology of her  symptoms patient receiving IV fluids for possible dehydration.  Repeat abdominal exam is soft and benign.  COVID is negative. [PR]  0254 Given her age acute hyponatremia presentation will consult hospitalist for admission. [PR]    Clinical Course User Index [PR] Merlyn Lot, MD   FINAL CLINICAL IMPRESSION(S) / ED DIAGNOSES   Final diagnoses:  Acute hyponatremia  Atypical chest pain     Rx / DC Orders   ED Discharge Orders     None        Note:  This document was prepared using Dragon voice recognition software and may include unintentional dictation errors.    Merlyn Lot, MD 07/22/22 [phone]

## 2022-07-22 NOTE — Assessment & Plan Note (Signed)
Troponin level 21, 55, 87, patient has chest pain.  Consulted Dr. Nehemiah Massed of cardiology  -Placed on telemetry bed for observation -Aspirin, Lipitor -As needed nitroglycerin and morphine -Trend troponin - Check A1c, FLP

## 2022-07-22 NOTE — Assessment & Plan Note (Signed)
-   IV hydralazine as needed -Hold HCTZ due to hyponatremia -Continue home lisinopril, metoprolol

## 2022-07-22 NOTE — Assessment & Plan Note (Signed)
-   Check C. difficile and GI pathogen panel 

## 2022-07-23 ENCOUNTER — Other Ambulatory Visit: Payer: Self-pay

## 2022-07-23 DIAGNOSIS — I214 Non-ST elevation (NSTEMI) myocardial infarction: Secondary | ICD-10-CM | POA: Diagnosis not present

## 2022-07-23 LAB — BASIC METABOLIC PANEL
Anion gap: 7 (ref 5–15)
BUN: 14 mg/dL (ref 8–23)
CO2: 26 mmol/L (ref 22–32)
Calcium: 8 mg/dL — ABNORMAL LOW (ref 8.9–10.3)
Chloride: 102 mmol/L (ref 98–111)
Creatinine, Ser: 0.64 mg/dL (ref 0.44–1.00)
GFR, Estimated: >60 mL/min (ref >60)
Glucose, Bld: 97 mg/dL (ref 70–99)
Potassium: 4.3 mmol/L (ref 3.5–5.1)
Sodium: 135 mmol/L (ref 135–145)

## 2022-07-23 LAB — GASTROINTESTINAL PANEL BY PCR, STOOL (REPLACES STOOL CULTURE)

## 2022-07-23 LAB — ECHOCARDIOGRAM COMPLETE
AR max vel: 1.94 cm2
AV Area VTI: 2.26 cm2
AV Area mean vel: 2 cm2
AV Mean grad: 5 mmHg
AV Peak grad: 9.6 mmHg
Ao pk vel: 1.55 m/s
Area-P 1/2: 3.42 cm2
Height: 64 in
S' Lateral: 2.33 cm
Weight: 2398.6 oz

## 2022-07-23 LAB — OSMOLALITY: Osmolality: 263 mOsm/kg — ABNORMAL LOW (ref 275–295)

## 2022-07-23 LAB — LIPID PANEL
Cholesterol: 191 mg/dL (ref 0–200)
HDL: 54 mg/dL (ref >40)
LDL Cholesterol: 121 mg/dL — ABNORMAL HIGH (ref 0–99)
Total CHOL/HDL Ratio: 3.5 RATIO
Triglycerides: 82 mg/dL (ref <150)
VLDL: 16 mg/dL (ref 0–40)

## 2022-07-23 LAB — C DIFFICILE QUICK SCREEN W PCR REFLEX
C Diff antigen: NEGATIVE
C Diff interpretation: NOT DETECTED
C Diff toxin: NEGATIVE

## 2022-07-23 LAB — TROPONIN I (HIGH SENSITIVITY): Troponin I (High Sensitivity): 37 ng/L — ABNORMAL HIGH (ref <18)

## 2022-07-23 LAB — OSMOLALITY, URINE: Osmolality, Ur: 162 mOsm/kg — ABNORMAL LOW (ref 300–900)

## 2022-07-23 NOTE — ED Notes (Signed)
Patient  diaper changed , patient cleaned purwick applied . Bed changed . Denies complaints at this time .

## 2022-07-23 NOTE — ED Notes (Signed)
Received patient in no acute distress breathing spontaneously to room air . Patient on continued cardiac monitoring . Denies chest pain or dizziness at this time .

## 2022-07-23 NOTE — Progress Notes (Signed)
PROGRESS NOTE    Kimberly Page  YBO:[id number] DOB: October 07, 1946 DOA: 07/22/2022 PCP: Rusty Aus, MD   Brief Narrative:  This 76 years old female with PMH significant for hypertension, hyperlipidemia, COPD, A-fib not on anticoagulation due to history of epistaxis, GERD, former smoker, hiatal hernia, s/p repair of paraesophageal hernia presented in the ED with complaints of chest pain associated with palpitations since last night.  Patient states that her PCP has adjusted her metoprolol dose, she describes chest pain is pressure-like, nonradiating, midsternal.  Cardiology is consulted, echocardiogram completed,  report is pending.  Patient did have a CTA chest in 2019 which showed normal coronary arteries.  As per cardiology chest pain appears atypical and can be discharged and follow-up 2 weeks with Dr. Ubaldo Glassing.  Assessment & Plan:   Principal Problem:   NSTEMI (non-ST elevated myocardial infarction) ([place]) Active Problems:   HTN (hypertension)   HLD (hyperlipidemia)   COPD (chronic obstructive pulmonary disease) ([facility])   PAF (paroxysmal atrial fibrillation) ([facility])   Hyponatremia   Diarrhea  Chest pain: Patient presented with chest pain,  nonradiating, pressure-like associated with palpitations. Troponin level 21, 55, 87,  Consulted Dr. Nehemiah Massed of cardiology Continue aspirin, Lipitor. As needed nitroglycerin and morphine Echocardiogram completed, report is pending. Chest pain has resolved, appears atypical. Patient did have CTA chest in 2019 which showed normal coronary arteries.   Essential hypertension: Continue IV hydralazine as needed. -Hold HCTZ due to hyponatremia -Continue home lisinopril, metoprolol   HLD (hyperlipidemia) Continue Lipitor   COPD (chronic obstructive pulmonary disease) (H[facility]) Stable. Continue Bronchodilators   PAF (paroxysmal atrial fibrillation) ([place]) Patient is not taking anticoagulants. Continue Metoprolol   Hyponatremia > Resolved  Sodium 123,  which improved to 135 with iv fluids. -Check osmolality of plasma and urine, and urine sodium -Hold HCTZ   Diarrhea C. difficile and GI panel negative. Diarrhea has resolved.   DVT prophylaxis: Lovenox Code Status: Full code Family Communication: No family at bedside Disposition Plan:   Status is: Observation The patient remains OBS appropriate and will d/c before 2 midnights. Admitted for chest pain associated with palpitation.  Patient evaluated by cardiology echocardiogram is done report is pending.  Anticipated discharge home 07/24/2022.  Consultants:  Cardiology  Procedures: Echocardiogram Antimicrobials: None  Subjective: Patient was seen and examined at bedside.  Overnight events noted.  Patient reports feeling much improved, she reports chest pain has resolved.  Echocardiogram was done,  report is pending.  Objective: Vitals:   07/23/22 0500 07/23/22 0554 07/23/22 0830 07/23/22 1200  BP: 134/74  (!) 167/72 (!) 160/80  Pulse:   74 82  Resp: 18  19 (!) 24  Temp:  98.3 F (36.8 C) 98.1 F (36.7 C) 98.7 F (37.1 C)  TempSrc:  Oral Oral Oral  SpO2:   100% 98%  Weight:      Height:        Intake/Output Summary (Last 24 hours) at 07/23/2022 1500 Last data filed at 07/23/2022 1300 Gross per 24 hour  Intake 720 ml  Output 900 ml  Net -180 ml   Filed Weights   07/22/22 0051  Weight: 68 kg    Examination:  General exam: Appears comfortable, not in any acute distress. Respiratory system: Clear to auscultation bilaterally, no wheezing, no crackles, normal respiratory effort. Cardiovascular system: S1 & S2 heard, regular rate and rhythm, no murmur.   Gastrointestinal system: Abdomen is soft, nontender, nondistended, BS +, Central nervous system: Alert and oriented x 3 . No focal  neurological deficits. Extremities: No edema, no cyanosis, no clubbing. Skin: No rashes, lesions or ulcers Psychiatry: Judgement and insight appear normal. Mood & affect appropriate.      Data Reviewed: I have personally reviewed following labs and imaging studies  CBC: Recent Labs  Lab 07/22/22 0057  WBC 8.2  HGB 14.6  HCT 41.1  MCV 85.3  PLT 762   Basic Metabolic Panel: Recent Labs  Lab 07/22/22 0057 07/22/22 0830 07/22/22 1308 07/23/22 0531  NA 123* 130* 128* 135  K 3.8 3.9 3.9 4.3  CL 87* 96* 95* 102  CO2 '23 26 25 26  '$ GLUCOSE 159* 112* 149* 97  BUN '13 10 10 14  '$ CREATININE 0.74 0.66 0.80 0.64  CALCIUM 9.6 8.5* 8.4* 8.0*   GFR: Estimated Creatinine Clearance: 57.6 mL/min (by C-G formula based on SCr of 0.64 mg/dL). Liver Function Tests: No results for input(s): "AST", "ALT", "ALKPHOS", "BILITOT", "PROT", "ALBUMIN" in the last 168 hours. No results for input(s): "LIPASE", "AMYLASE" in the last 168 hours. No results for input(s): "AMMONIA" in the last 168 hours. Coagulation Profile: Recent Labs  Lab 07/22/22 0057  INR 1.0   Cardiac Enzymes: No results for input(s): "CKTOTAL", "CKMB", "CKMBINDEX", "TROPONINI" in the last 168 hours. BNP (last 3 results) No results for input(s): "PROBNP" in the last 8760 hours. HbA1C: Recent Labs    07/22/22 0057  HGBA1C 5.4   CBG: No results for input(s): "GLUCAP" in the last 168 hours. Lipid Profile: Recent Labs    07/23/22 0531  CHOL 191  HDL 54  LDLCALC 121*  TRIG 82  CHOLHDL 3.5   Thyroid Function Tests: Recent Labs    07/22/22 0057  TSH 2.305   Anemia Panel: No results for input(s): "VITAMINB12", "FOLATE", "FERRITIN", "TIBC", "IRON", "RETICCTPCT" in the last 72 hours. Sepsis Labs: No results for input(s): "PROCALCITON", "LATICACIDVEN" in the last 168 hours.  Recent Results (from the past 240 hour(s))  SARS Coronavirus 2 by RT PCR (hospital order, performed in [facility] hospital lab) *cepheid single result test* Anterior Nasal Swab     Status: None   Collection Time: 07/22/22  1:51 AM   Specimen: Anterior Nasal Swab  Result Value Ref Range Status   SARS Coronavirus 2 by RT PCR  NEGATIVE NEGATIVE Final    Comment: (NOTE) SARS-CoV-2 target nucleic acids are NOT DETECTED.  The SARS-CoV-2 RNA is generally detectable in upper and lower respiratory specimens during the acute phase of infection. The lowest concentration of SARS-CoV-2 viral copies this assay can detect is 250 copies / mL. A negative result does not preclude SARS-CoV-2 infection and should not be used as the sole basis for treatment or other patient management decisions.  A negative result may occur with improper specimen collection / handling, submission of specimen other than nasopharyngeal swab, presence of viral mutation(s) within the areas targeted by this assay, and inadequate number of viral copies (<250 copies / mL). A negative result must be combined with clinical observations, patient history, and epidemiological information.  Fact Sheet for Patients:   https://www.patel.info/  Fact Sheet for Healthcare Providers: https://hall.com/  This test is not yet approved or  cleared by the [place] FDA and has been authorized for detection and/or diagnosis of SARS-CoV-2 by FDA under an Emergency Use Authorization (EUA).  This EUA will remain in effect (meaning this test can be used) for the duration of the COVID-19 declaration under Section 564(b)(1) of the Act, 21 U.S.C. section 360bbb-3(b)(1), unless the authorization is terminated or revoked  sooner.  Performed at [facility], [place], [place] [address]   C Difficile Quick Screen w PCR reflex     Status: None   Collection Time: 07/23/22  9:52 AM   Specimen: STOOL  Result Value Ref Range Status   C Diff antigen NEGATIVE NEGATIVE Final   C Diff toxin NEGATIVE NEGATIVE Final   C Diff interpretation No C. difficile detected.  Final    Comment: Performed at [facility], [address] [place], [place] [address]  Gastrointestinal Panel by PCR , Stool      Status: None   Collection Time: 07/23/22  9:53 AM   Specimen: Stool  Result Value Ref Range Status   Campylobacter species NOT DETECTED NOT DETECTED Final   Plesimonas shigelloides NOT DETECTED NOT DETECTED Final   Salmonella species NOT DETECTED NOT DETECTED Final   Yersinia enterocolitica NOT DETECTED NOT DETECTED Final   Vibrio species NOT DETECTED NOT DETECTED Final   Vibrio cholerae NOT DETECTED NOT DETECTED Final   Enteroaggregative E coli (EAEC) NOT DETECTED NOT DETECTED Final   Enteropathogenic E coli (EPEC) NOT DETECTED NOT DETECTED Final   Enterotoxigenic E coli (ETEC) NOT DETECTED NOT DETECTED Final   Shiga like toxin producing E coli (STEC) NOT DETECTED NOT DETECTED Final   Shigella/Enteroinvasive E coli (EIEC) NOT DETECTED NOT DETECTED Final   Cryptosporidium NOT DETECTED NOT DETECTED Final   Cyclospora cayetanensis NOT DETECTED NOT DETECTED Final   Entamoeba histolytica NOT DETECTED NOT DETECTED Final   Giardia lamblia NOT DETECTED NOT DETECTED Final   Adenovirus F40/41 NOT DETECTED NOT DETECTED Final   Astrovirus NOT DETECTED NOT DETECTED Final   Norovirus GI/GII NOT DETECTED NOT DETECTED Final   Rotavirus A NOT DETECTED NOT DETECTED Final   Sapovirus (I, II, IV, and V) NOT DETECTED NOT DETECTED Final    Comment: Performed at [facility], [address] [place],  [address]    Radiology Studies: CT HEAD WO CONTRAST (5MM)  Result Date: 07/22/2022 CLINICAL DATA:  Sudden onset headaches 4 hours ago EXAM: CT HEAD WITHOUT CONTRAST TECHNIQUE: Contiguous axial images were obtained from the base of the skull through the vertex without intravenous contrast. RADIATION DOSE REDUCTION: This exam was performed according to the departmental dose-optimization program which includes automated exposure control, adjustment of the mA and/or kV according to patient size and/or use of iterative reconstruction technique. COMPARISON:  08/08/2020 FINDINGS: Brain: No evidence of  acute infarction, hemorrhage, hydrocephalus, extra-axial collection or mass lesion/mass effect. Vascular: No hyperdense vessel or unexpected calcification. Skull: Normal. Negative for fracture or focal lesion. Sinuses/Orbits: No acute finding. Other: None. IMPRESSION: No acute intracranial abnormality noted. Electronically Signed   By: Inez Catalina M.D.   On: 07/22/2022 01:25   DG Chest Port 1 View  Result Date: 07/22/2022 CLINICAL DATA:  Chest pain and shortness of breath for several hours EXAM: PORTABLE CHEST 1 VIEW COMPARISON:  06/27/2022 FINDINGS: Cardiac shadow is within normal limits. Aortic calcifications are noted. Lungs are well aerated bilaterally. No bony abnormality is seen. IMPRESSION: No acute abnormality noted. Electronically Signed   By: Inez Catalina M.D.   On: 07/22/2022 01:11    Scheduled Meds:  aspirin EC  81 mg Oral Daily   atorvastatin  40 mg Oral Daily   calcium-vitamin D  1 tablet Oral Q1400   vitamin B-12  500 mcg Oral Daily   enoxaparin (LOVENOX) injection  40 mg Subcutaneous Q24H   fluticasone  2 spray Each  Nare Daily   lisinopril  10 mg Oral Daily   loratadine  10 mg Oral BH-q7a   magnesium oxide  400 mg Oral BH-q7a   metoprolol succinate  37.5 mg Oral Daily   montelukast  10 mg Oral Daily   oxybutynin  5 mg Oral BID   vitamin E  200 Units Oral Once per day on Sun Tue Thu Sat   Continuous Infusions:   LOS: 0 days    Time spent: 45 mins    Eugen Jeansonne, MD [facility]   If 7PM-7AM, please contact night-coverage

## 2022-07-23 NOTE — Progress Notes (Signed)
[place] NOTE       Patient ID: Kimberly Page MRN: [id number] DOB/AGE: [birth year] 76 y.o.  Admit date: 07/22/2022 Referring Physician Dr. Blaine Hamper Primary Physician Dr. Emily Filbert Primary Cardiologist Dr. Corky Sox / Ubaldo Glassing Reason for Consultation chest pain   HPI: Kimberly Page is a 76yoF with a PMH of paroxysmal atrial tachycardia (off AC d/t epistaxis), hyperlipidemia, atypical chest pain, COPD/reactive airway disease, history of tobacco use, who presents to [facility] ED in the early morning hours of 07/22/2022 with generalized malaise, weakness, and a headache with poor p.o. intake for several days.  Cardiology is consulted for further assistance with her chest discomfort.  Interval History: - sodium normalized, troponin peaked at 87  - feels "much better in every way" today. No chest pain, shortness of breath.  - fleeting palpitations this morning when she was talking to her boss about returning to work, no acute events on telemetry   Review of systems complete and found to be negative unless listed above     Past Medical History:  Diagnosis Date   A-fib ([place])    Abnormal chest x-ray with multiple lung nodules 05/2019   Arthritis    left foot   Carpal tunnel syndrome    Complication of anesthesia    COPD (chronic obstructive pulmonary disease) (HCC)    Dyspnea    WITH EXERTION   Dysrhythmia    atrial fibrillation   Family history of adverse reaction to anesthesia    pt unsure-mom died at a young age and unsure about dad   GERD (gastroesophageal reflux disease)    Hemangioma of liver    History of hiatal hernia    HTN (hypertension)    Pinched nerve in shoulder, left    LEFT   PONV (postoperative nausea and vomiting) 1958   had eye surgery 5th grade and she got sick    Urinary incontinence    Vertigo     Past Surgical History:  Procedure Laterality Date   ABDOMINAL HYSTERECTOMY     BLADDER SUSPENSION  01/24/2008   BREAST BIOPSY Left 12/2004    core- neg   CATARACT EXTRACTION Left 06/14/2013   CATARACT EXTRACTION Right 06/28/2013   CHOLECYSTECTOMY  08/2008   COLONOSCOPY     DIAGNOSTIC LAPAROSCOPY     ESOPHAGOGASTRODUODENOSCOPY (EGD) WITH PROPOFOL N/A 06/03/2019   Procedure: ESOPHAGOGASTRODUODENOSCOPY (EGD) WITH PROPOFOL;  Surgeon: Lollie Sails, MD;  Location[facility];  Service: Endoscopy;  Laterality: N/A;   EYE SURGERY Right 1958   FOOT SURGERY Right 09/07/2014   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 Foot still partially numb.  PINS   HEMORROIDECTOMY  11/1977   HERNIA REPAIR  01/24/2008   KNEE ARTHROSCOPY Right    KNEE SURGERY Left 12/1997   LARYNGOSCOPY  11/2005   reactive airway disease     seasonal allergies     TUBAL LIGATION     TYMPANOSTOMY TUBE PLACEMENT  1977   VAGINAL HYSTERECTOMY  04/1986    (Not in a hospital admission)  Social History   Socioeconomic History   Marital status: Widowed    Spouse name: Not on file   Number of children: 1   Years of education: 12   Highest education level: Not on file  Occupational History   Occupation: Programme researcher, broadcasting/film/video  Tobacco Use   Smoking status: Former    Packs/day: 1.50    Years: 45.00    Total pack years: 67.50    Types: Cigarettes  Quit date: 12/23/1999    Years since quitting: 22.5   Smokeless tobacco: Never  Vaping Use   Vaping Use: Never used  Substance and Sexual Activity   Alcohol use: Yes    Alcohol/week: 0.0 standard drinks of alcohol    Comment: wine occ   Drug use: No   Sexual activity: Not on file  Other Topics Concern   Not on file  Social History Narrative   Lives alone   Caffeine use:  None   Sons' family lives next door to patient.  Will assist with post op care   Social Determinants of Health   Financial Resource Strain: Not on file  Food Insecurity: Not on file  Transportation Needs: Not on file  Physical Activity: Not on file  Stress: Not on file  Social Connections: Not on file  Intimate  Partner Violence: Not on file    Family History  Problem Relation Age of Onset   Hypertension Mother    Stroke Mother    Leukemia Father    Cancer Other    Breast cancer Paternal Aunt    Breast cancer Cousin       PHYSICAL EXAM General: Elderly Caucasian female, well nourished, in no acute distress.  Sitting upright in ED stretcher HEENT:  Normocephalic and atraumatic. Neck:  No JVD.  Lungs: Normal respiratory effort on room air. Clear bilaterally to auscultation. No wheezes, crackles, rhonchi.  Heart: HRRR . Normal S1 and S2 without gallops or murmurs. Radial & DP pulses 2+ bilaterally. Abdomen: Non-distended appearing.  Msk: Normal strength and tone for age. Extremities: Warm and well perfused. No clubbing, cyanosis.  No peripheral edema.  Neuro: Alert and oriented X 3. Psych:  anxious mood. Answers questions appropriately.   Labs:   Lab Results  Component Value Date   WBC 8.2 07/22/2022   HGB 14.6 07/22/2022   HCT 41.1 07/22/2022   MCV 85.3 07/22/2022   PLT 333 07/22/2022    Recent Labs  Lab 07/23/22 0531  NA 135  K 4.3  CL 102  CO2 26  BUN 14  CREATININE 0.64  CALCIUM 8.0*  GLUCOSE 97    Lab Results  Component Value Date   TROPONINI <0.03 08/11/2017    Lab Results  Component Value Date   CHOL 191 07/23/2022   Lab Results  Component Value Date   HDL 54 07/23/2022   Lab Results  Component Value Date   LDLCALC 121 (H) 07/23/2022   Lab Results  Component Value Date   TRIG 82 07/23/2022   Lab Results  Component Value Date   CHOLHDL 3.5 07/23/2022   No results found for: "LDLDIRECT"    Radiology: CT HEAD WO CONTRAST (5MM)  Result Date: 07/22/2022 CLINICAL DATA:  Sudden onset headaches 4 hours ago EXAM: CT HEAD WITHOUT CONTRAST TECHNIQUE: Contiguous axial images were obtained from the base of the skull through the vertex without intravenous contrast. RADIATION DOSE REDUCTION: This exam was performed according to the departmental  dose-optimization program which includes automated exposure control, adjustment of the mA and/or kV according to patient size and/or use of iterative reconstruction technique. COMPARISON:  08/08/2020 FINDINGS: Brain: No evidence of acute infarction, hemorrhage, hydrocephalus, extra-axial collection or mass lesion/mass effect. Vascular: No hyperdense vessel or unexpected calcification. Skull: Normal. Negative for fracture or focal lesion. Sinuses/Orbits: No acute finding. Other: None. IMPRESSION: No acute intracranial abnormality noted. Electronically Signed   By: Inez Catalina M.D.   On: 07/22/2022 01:25   DG Chest Port 1  View  Result Date: 07/22/2022 CLINICAL DATA:  Chest pain and shortness of breath for several hours EXAM: PORTABLE CHEST 1 VIEW COMPARISON:  06/27/2022 FINDINGS: Cardiac shadow is within normal limits. Aortic calcifications are noted. Lungs are well aerated bilaterally. No bony abnormality is seen. IMPRESSION: No acute abnormality noted. Electronically Signed   By: Inez Catalina M.D.   On: 07/22/2022 01:11   DG Chest 2 View  Result Date: 06/27/2022 CLINICAL DATA:  Chest pain. EXAM: CHEST - 2 VIEW COMPARISON:  08/08/2020. FINDINGS: Chronic hyperinflation. No consolidation. No visible pleural effusions pneumothorax per cardiomediastinal silhouette is unchanged. Cholecystectomy clips. IMPRESSION: Chronic hyperinflation. No evidence of acute cardiopulmonary disease. Electronically Signed   By: Margaretha Sheffield M.D.   On: 06/27/2022 12:41    12/02/2018 CTA heart with contrast  Comparison: None   Technique:  Contrast enhanced Prospective ECG gated CTA of the heart was  performed following the administration of IV contrast. If IV contrast  material had not been administered, the likelihood of detecting  abnormalities relevant to the patient's condition would have been  substantially decreased.  3-D volumetric and curved planar reformats were  likewise performed as indicated to increase the  sensitivity of detecting  clinically relevant pathology.   Patient specific Information:  Blood Pressure at closest time to scan: 120/61  Heart rate during exam: 88  Heart rhythm during exam: Regular  Beta-blockers administered: Patient took 50 mg metoprolol by mouth at time  at 0515 hours  Nitroglycerin administered: 0.8 mg.   Limitations of study if any: None.   Findings:  Coronary Arteries:  There is right dominance of the coronary arterial circulation.  The  coronary artery ostia are in normal location.   Left Main:  Mild calcified plaque at the origin without significant stenosis.   Left Anterior Descending: No significant atherosclerotic disease or  stenosis   Diagonals: D1 and D2 patent.   Left Circumflex: Minimal calcified plaque in the proximal left circumflex  without significant stenosis. Small amount of calcified plaque in the mid  LAD causing less than 25% stenosis.   Obtuse Marginals: No significant atherosclerotic largest obtuse marginal  branch without significant stenosis.   Right Coronary Artery: No significant atherosclerotic disease or stenosis.   Chamber Morphology and Function:  Mild left atrial enlargement. No left atrial thrombus. Left atrial  appendage without thrombus. Left ventricle normal in size without thrombus.   Mitral valve leaflet mildly thickened.  Logically tricuspid aortic valve. Mild thickening of the valve leaflets.     CT Chest:  Minimal atelectasis and bronchiectasis in the right middle lobe and  lingula. Bilateral pulmonary nodules, the largest nodule in the right lower  lobe (series 10, image 98) measuring 1.2 x 1.0 cm.   No pericardial effusion. Minimal atherosclerotic disease in the visualized  portion of the thoracic aorta without aneurysm. No lymphadenopathy in the  visualized portion of the mediastinum. Partially visualized moderate hiatal  hernia. No destructive osseous lesion.     Impression:  1. Normal origin  and course of coronary arteries  2. Minimal scattered atherosclerotic disease with luminal narrowing under  25% . CAD RADS 1.  3. Bilateral pulmonary nodules in the partially visualized lung, the  largest in the right lower lobe measuring average diameter of 1.1 cm.  Recommend follow-up CT chest in 3 months or PET/CT.       CAD-RADS Reporting and Data System for patients presenting with stable  chest pain (vessels greater than 1.5 mm diameter)  Category  Degree of maximal coronary stenosis                       Interpretation  CAD-RADS 0               0 (no plaque or stenosis)                                 No CAD present  CAD-RADS 1               1-24% (minimal stenosis or plaque with no  stenosis)       Minimal non-obstructive CAD  CAD-RADS 2               25-49% mild luminal stenosis                               Mild non-obstructive CAD  CAD-RADS 3               50-69% moderate luminal stenosis                           Moderate Stenosis  CAD-RADS 4A              70-99% severe luminal stenosis                             Severe Stenosis  CAD-RADS 4B              left main > 50% or 3 vessel obstructive (>70%  disease     Severe Stenosis  CAD-RADS 5               100% (total occlusion)                                     Total coronary occlusion  CAD-RADS N               non-diagnostic                                             Obstructive CAD can not be excluded   Modifiers:  N: non-diagnostic  S: stent  G: graft  V: vulnerable   Electronically Reviewed by:  Tyrell Antonio, MD, [facility]  Electronically Reviewed on:  12/02/2018 12:12 PM   I have reviewed the images and concur with the above findings.   Electronically Signed by:  Burns Spain, MD, [facility]  Electronically Signed on:  12/02/2018 12:15 PM  11/11/2018 Impression  Lexiscan stress.  LV function normal.  Small area of mild anterior  hypoperfusion with stress with partial  redistribution with rest.  Moderate  risk study.  Clinical correlation recommended. Narrative  CARDIOLOGY DEPARTMENT  [facility]  [facility]  [address] [place],   [address]  [phone]   Procedure: Pharmacologic Myocardial Perfusion Imaging    ONE day procedure   Indication: Chest pain at rest, unspecified  Plan: NM myocardial perfusion SPECT multiple (stress        and rest), ECG stress test only  Ordering Physician:   Dr. Bartholome Bill    Clinical History:  76 y.o. year old female  Vitals: Height: 32 in Weight: 179 lb  Cardiac risk factors include:     PAT, COPD and Hyperlipidemia    Procedure:   Pharmacologic stress testing was performed with Regadenoson using a single  use 0.'4mg'$ /32m (0.08 mg/ml) prefilled syringe intravenously infused as a  bolus dose. The stress test was stopped due to Infusion completion.  Blood  pressure response was normal. The patient did not develop any symptoms  other than fatigue during the procedure.   Rest HR: 55bpm  Rest BP: 116/767mg  Max HR: 82bpm  Min BP: 116/7238m   Stress Test Administered by: DINOswald HillockMA   ECG Interpretation:  Rest ECG:  normal sinus rhythm, none  Stress ECG:  normal sinus rhythm, no arrhythmia or ischemia  Recovery ECG:  normal sinus rhythm  ECG Interpretation:  non-diagnostic due to pharmacologic testing.    Administrations This Visit     regadenoson (LEXISCAN) 0.4 mg/5 mL inj syringe 0.4 mg     Admin Date  11/10/2018 Action  Given Dose  0.4 mg Route  Intravenous Administered By  GoaHerbert SetaNMT     technetium [id number] (CARDIOLITE) injection 12.197.9licurie     Admin Date  11/10/2018 Action  Given Dose  12.189.2licurie Route  Intravenous Administered By  GoarHerbert SetaMT   Gated post-stress perfusion imaging was performed 30 minutes after stress.  Rest images were performed 30 minutes after injection.   Gated LV Analysis:  TID  Ratio: 1.07   LVEF= 64%   FINDINGS:  Regional wall motion:  reveals normal myocardial thickening and wall  motion.  The overall quality of the study is good.    Artifacts noted: no  Left ventricular cavity: normal.   Perfusion Analysis:  SPECT images demonstrate small perfusion abnormality  of mild intensity is present in the anterior region on the stress images.    ECHO 09/17/2017  STRESS ECHOCARDIOGRAPHY            Protocol:Treadmill ([place])              Drugs:None  Target Heart Rate:128 bpm        Maximum Predicted Heart Rate: 150 bpm   +-------------------+-------------------------+-------------------------+------------+         Stage            Duration (mm:ss)         Heart Rate (bpm)         BP       +-------------------+-------------------------+-------------------------+------------+        RESTING                                           55              130/73     +-------------------+-------------------------+-------------------------+------------+        EXERCISE                3:56                     136                /        +-------------------+-------------------------+-------------------------+------------+        RECOVERY  4:43                      73              147/76     +-------------------+-------------------------+-------------------------+------------+     Stress Duration:3:56 mm:ss    Max Stress H.R.:136 bpm        Target Heart Rate Achieved: Yes   ___________________________________________________________________________________________   WALL SEGMENT CHANGES                         Rest          Stress  Anterior Septum Basal:Normal        Hyperkinetic                    STM:HDQQIW        Hyperkinetic                 Apical:Normal        Hyperkinetic     Anterior Wall Basal:Normal        Hyperkinetic                    LNL:GXQJJH        Hyperkinetic                 Apical:Normal        Hyperkinetic       Lateral Wall Basal:Normal        Hyperkinetic                    ERD:EYCXKG        Hyperkinetic                 Apical:Normal        Hyperkinetic    Posterior Wall Basal:Normal        Hyperkinetic                    YJE:HUDJSH        Hyperkinetic     Inferior Wall Basal:Normal        Hyperkinetic                    FWY:OVZCHY        Hyperkinetic                 Apical:Normal        Hyperkinetic   Inferior Septum Basal:Normal        Hyperkinetic                    IFO:YDXAJO        Hyperkinetic              Resting EF:>55% (Est.)   Stress EF: >55% (Est.)   ___________________________________________________________________________________________   ADDITIONAL FINDINGS    ___________________________________________________________________________________________   STRESS ECG RESULTS                                                ECG Results:Normal   ___________________________________________________________________________________________  ECHOCARDIOGRAPHIC DESCRIPTIONS   LEFT VENTRICLE         Size:Normal  Contraction:Normal    LV Masses:No Masses          INO:MVEH   RIGHT VENTRICLE  Size:Normal                Free Wall:Normal  Contraction:Normal                RV Masses:No mass   PERICARDIUM        Fluid:No effusion    _______________________________________________________________________________________  DOPPLER ECHO and OTHER SPECIAL PROCEDURES     Aortic:No AR                      No AS      Mitral:TRIVIAL MR                 No MS            MV Inflow E Vel=nm*        MV Annulus E'Vel=nm*            E/E'Ratio=nm*   Tricuspid:TRIVIAL TR                 No TS            190.0 cm/sec peak TR vel   19.4 mmHg peak RV pressure   Pulmonary:No PR                      No PS     ___________________________________________________________________________________________  ECHOCARDIOGRAPHIC MEASUREMENTS  2D DIMENSIONS  AORTA             Values      Normal  Range      MAIN PA          Values      Normal Range            Annulus:  nm*       [2.1 - 2.5]                PA Main:  nm*       [1.5 - 2.1]          Aorta Sin:  nm*       [2.7 - 3.3]       RIGHT VENTRICLE        ST Junction:  nm*       [2.3 - 2.9]                RV Base:  nm*       [ < 4.2]          Asc.Aorta:  nm*       [2.3 - 3.1]                 RV Mid:  nm*       [ < 3.5]   LEFT VENTRICLE                                        RV Length:  nm*       [ < 8.6]              LVIDd:  nm*       [3.9 - 5.3]       INFERIOR VENA CAVA              LVIDs:  nm*                                 Max.  IVC:  nm*       [ <= 2.1]                 FS:  nm*       [> 25]                    Min. IVC:  nm*                SWT:  nm*       [0.5 - 0.9]                   ------------------                PWT:  nm*       [0.5 - 0.9]                   nm* - not measured   LEFT ATRIUM            LA Diam:  nm*       [2.7 - 3.8]        LA A4C Area:  nm*       [ < 20]          LA Volume:  nm*       [22 - 52]    ___________________________________________________________________________________________  INTERPRETATION  Normal Stress Echocardiogram  NORMAL RIGHT VENTRICULAR SYSTOLIC FUNCTION  TRIVIAL REGURGITATION NOTED (See above)  NO VALVULAR STENOSIS NOTED  Resting EF: >55% (Est.)  Post Stress EF:>55% (Est.)  ECG Results: Normal  Mitral: TRIVIAL MR  Tricuspid: TRIVIAL TR   TELEMETRY reviewed by me: Sinus bradycardia with rate in the high 50s to sinus rhythm with rate in the 60s to 70s.  EKG reviewed by me: sinus rhythm rate 92 with TWI in aVL without other ischemic changes  ASSESSMENT AND PLAN:  Kimberly Page is a 76yoF with a PMH of paroxysmal atrial tachycardia (off AC d/t epistaxis), hyperlipidemia, atypical chest pain, COPD/reactive airway disease, history of tobacco use, who presents to [facility] ED in the early morning hours of 07/22/2022 with generalized malaise, weakness, and a headache with poor p.o.  intake for several days.  Cardiology is consulted for further assistance with her chest discomfort.  #Dehydration #Palpitations with history of paroxysmal atrial tachycardia #Chest tightness #Elevated troponin The patient presents with 2 to 3 days of poor p.o. intake and nausea and several brief episodes of heart racing and associated chest tightness, was found to be fairly hyponatremic sodium 123, and was recently started on lisinopril-HCTZ 12.5 approximately 10 days ago by her PCP for better blood pressure control.  She has had ongoing issues with episodes of heart racing over the past month, in the setting of GI illness/food poisoning and recently had her metoprolol increased to 37.5 mg twice daily which had seemed to help her symptoms up until 2 to 3 days ago.  Her EKG shows sinus rhythm with a TWI in aVL without other ischemic changes and her troponin is slightly elevated trending 21-55-87 and downtrended thereafter to 74-40-37  At my time of evaluation on day of admission, the patient feels better, denies further episodes of heart racing or chest tightness since she has been in the ER. She has been in sinus rhythm in the 60s-70s on telemetry. I suspect that her heart racing is primarily in the setting of dehydration and starting the thiazide diuretic contributing to her hyponatremia, troponin elevation is likely demand in the setting. -  Agree with current therapy with IV fluid resuscitation -troponin peaked at 87  -S/p 324 mg aspirin, continue 81 mg aspirin daily -discussed continuing atorvastatin as her LDL is above goal at 121, patient prefers to discuss this further with her PCP before continuing  -Continue metoprolol XL 37.5 mg twice daily -Continue lisinopril 10 mg, discontinue HCTZ and do not restart a discharge. -echocardiogram complete, pending read. note the patient had a CTA chest in 2019 that showed normal coronary arteries. -ok for discharge today from a cardiac standpoint, follow up  with Dr. Ubaldo Glassing in 1-2 weeks.   This patient's plan of care was discussed and created with Dr. Nehemiah Massed and he is in agreement.  Signed: Tristan Schroeder , PA-C 07/23/2022, 8:27 AM [facility]

## 2022-07-23 NOTE — ED Notes (Signed)
Patient in no acute distress . Denies complaints at this time . Continuing monitoring . 5am labs sent .

## 2022-07-24 ENCOUNTER — Other Ambulatory Visit: Payer: Self-pay

## 2022-07-24 DIAGNOSIS — I214 Non-ST elevation (NSTEMI) myocardial infarction: Secondary | ICD-10-CM | POA: Diagnosis not present

## 2022-07-24 LAB — TROPONIN I (HIGH SENSITIVITY)
Troponin I (High Sensitivity): 19 ng/L — ABNORMAL HIGH (ref <18)
Troponin I (High Sensitivity): 24 ng/L — ABNORMAL HIGH (ref <18)
Troponin I (High Sensitivity): 22 ng/L — ABNORMAL HIGH (ref <18)

## 2022-07-24 MED ORDER — METOPROLOL SUCCINATE ER 25 MG PO TB24
37.5000 mg | ORAL_TABLET | Freq: Two times a day (BID) | ORAL | Status: DC
Start: 2022-07-24 — End: 2022-07-25
  Administered 2022-07-24 – 2022-07-25 (×2): 37.5 mg via ORAL
  Filled 2022-07-24 (×2): qty 2

## 2022-07-24 MED ORDER — LISINOPRIL 10 MG PO TABS: 10.0000 mg | ORAL_TABLET | Freq: Every day | ORAL | 1 refills | Status: DC

## 2022-07-24 MED ORDER — METOPROLOL SUCCINATE ER 25 MG PO TB24: 37.5000 mg | ORAL_TABLET | Freq: Two times a day (BID) | ORAL | 2 refills | Status: AC

## 2022-07-24 MED ORDER — METOPROLOL SUCCINATE ER 25 MG PO TB24: 37.5000 mg | ORAL_TABLET | Freq: Two times a day (BID) | ORAL | 2 refills | Status: DC

## 2022-07-24 MED ORDER — ATORVASTATIN CALCIUM 40 MG PO TABS: 40.0000 mg | ORAL_TABLET | Freq: Every day | ORAL | 1 refills | Status: DC

## 2022-07-24 NOTE — Progress Notes (Addendum)
Patient was seen evaluated and examined by me and the PA on 07/24/22.  Course of action, evaluation, and management decisions were developed solely by me, but detailed below in the PA's Page.  Kimberly Page       Patient ID: Kimberly Page MRN: [id number] DOB/AGE: 1946-04-17 76 y.o.  Admit date: 07/22/2022 Referring Physician Dr. Blaine Hamper Primary Physician Dr. Emily Filbert Primary Cardiologist Dr. Corky Sox / Ubaldo Glassing Reason for Consultation chest pain   HPI: Kimberly Page is a 43yoF with a PMH of paroxysmal atrial tachycardia (off AC d/t epistaxis), hyperlipidemia, atypical chest pain, COPD/reactive airway disease, history of tobacco use, who presents to [facility] ED in the early morning hours of 07/22/2022 with generalized malaise, weakness, and a headache with poor p.o. intake for several days.  Cardiology is consulted for further assistance with her chest discomfort.  Interval History: - echo resulted with preserved LVEF, no WMA or significant valvular pathologies - no chest pain, shortness of breath, or palpitations   Review of systems complete and found to be negative unless listed above   Past Medical History:  Diagnosis Date   A-fib (HCC)    Abnormal chest x-ray with multiple lung nodules 05/2019   Arthritis    left foot   Carpal tunnel syndrome    Complication of anesthesia    COPD (chronic obstructive pulmonary disease) (HCC)    Dyspnea    WITH EXERTION   Dysrhythmia    atrial fibrillation   Family history of adverse reaction to anesthesia    pt unsure-mom died at a young age and unsure about dad   GERD (gastroesophageal reflux disease)    Hemangioma of liver    History of hiatal hernia    HTN (hypertension)    Pinched nerve in shoulder, left    LEFT   PONV (postoperative nausea and vomiting) 1958   had eye surgery 5th grade and she got sick    Urinary incontinence    Vertigo     Past Surgical History:  Procedure Laterality Date   ABDOMINAL  HYSTERECTOMY     BLADDER SUSPENSION  01/24/2008   BREAST BIOPSY Left 12/2004   core- neg   CATARACT EXTRACTION Left 06/14/2013   CATARACT EXTRACTION Right 06/28/2013   CHOLECYSTECTOMY  08/2008   COLONOSCOPY     DIAGNOSTIC LAPAROSCOPY     ESOPHAGOGASTRODUODENOSCOPY (EGD) WITH PROPOFOL N/A 06/03/2019   Procedure: ESOPHAGOGASTRODUODENOSCOPY (EGD) WITH PROPOFOL;  Surgeon: Lollie Sails, MD;  Location: [facility] ENDOSCOPY;  Service: Endoscopy;  Laterality: N/A;   EYE SURGERY Right 1958   FOOT SURGERY Right 09/07/2014   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 Foot still partially numb.  PINS   HEMORROIDECTOMY  11/1977   HERNIA REPAIR  01/24/2008   KNEE ARTHROSCOPY Right    KNEE SURGERY Left 12/1997   LARYNGOSCOPY  11/2005   reactive airway disease     seasonal allergies     TUBAL LIGATION     TYMPANOSTOMY TUBE PLACEMENT  1977   VAGINAL HYSTERECTOMY  04/1986    Medications Prior to Admission  Medication Sig Dispense Refill Last Dose   ADVAIR HFA 115-21 MCG/ACT inhaler SMARTSIG:2 Puff(s) Via Inhaler Every 12 Hours   Past Week   aspirin 81 MG tablet Take 81 mg by mouth daily.   Past Week   Calcium Carb-Cholecalciferol (CALCIUM 600+D3 PO) Take 1 tablet by mouth 2 (two) times a day.   Past Week   Cyanocobalamin (VITAMIN B-12 PO) Place 1 tablet  under the tongue daily.    Past Week   fluticasone (FLONASE) 50 MCG/ACT nasal spray Place 2 sprays into both nostrils daily.   Past Week   lisinopril-hydrochlorothiazide (ZESTORETIC) 10-12.5 MG tablet Take 1 tablet by mouth daily.   Past Week   loratadine (CLARITIN) 10 MG tablet Take 10 mg by mouth every morning.    Past Week   Magnesium 500 MG TABS Take 500 mg by mouth every morning.    Past Week   metoprolol succinate (TOPROL-XL) 25 MG 24 hr tablet Take 37.5 mg by mouth daily.   Past Week   montelukast (SINGULAIR) 10 MG tablet Take 10 mg by mouth daily.   Past Week   oxybutynin (DITROPAN) 5 MG tablet Take 5 mg by mouth 2 (two)  times daily.   Past Week   vitamin E 200 UNIT capsule Take 200 Units by mouth 4 (four) times a week. Takes Sundays, Mondays, Tuesdays and Wednesdays.   Past Week   acetaminophen (TYLENOL) 500 MG tablet Take 500 mg by mouth every 6 (six) hours as needed for moderate pain or headache.    prn at prn   albuterol (PROVENTIL HFA;VENTOLIN HFA) 108 (90 Base) MCG/ACT inhaler Inhale 2 puffs into the lungs every 6 (six) hours as needed for wheezing or shortness of breath. 18 g 2 prn at prn   Tetrahydroz-Dextran-PEG-Povid (EYE DROPS ADVANCED RELIEF OP) Place 1 drop into both eyes daily as needed (for dry eyes).   prn at prn    Social History   Socioeconomic History   Marital status: Widowed    Spouse name: Not on file   Number of children: 1   Years of education: 44   Highest education level: Not on file  Occupational History   Occupation: Programme researcher, broadcasting/film/video  Tobacco Use   Smoking status: Former    Packs/day: 1.50    Years: 45.00    Total pack years: 67.50    Types: Cigarettes    Quit date: 12/23/1999    Years since quitting: 22.6   Smokeless tobacco: Never  Vaping Use   Vaping Use: Never used  Substance and Sexual Activity   Alcohol use: Yes    Alcohol/week: 0.0 standard drinks of alcohol    Comment: wine occ   Drug use: No   Sexual activity: Not on file  Other Topics Concern   Not on file  Social History Narrative   Lives alone   Caffeine use:  None   Sons' family lives next door to patient.  Will assist with post op care   Social Determinants of Health   Financial Resource Strain: Not on file  Food Insecurity: Not on file  Transportation Needs: Not on file  Physical Activity: Not on file  Stress: Not on file  Social Connections: Not on file  Intimate Partner Violence: Not on file    Family History  Problem Relation Age of Onset   Hypertension Mother    Stroke Mother    Leukemia Father    Cancer Other    Breast cancer Paternal Aunt    Breast cancer Cousin        PHYSICAL EXAM General: Elderly Caucasian female, well nourished, in no acute distress.  Sitting upright in ED stretcher HEENT:  Normocephalic and atraumatic. Neck:  No JVD.  Lungs: Normal respiratory effort on room air. Clear bilaterally to auscultation. No wheezes, crackles, rhonchi.  Heart: HRRR . Normal S1 and S2 without gallops or murmurs. Radial & DP pulses 2+ bilaterally.  Abdomen: Non-distended appearing.  Msk: Normal strength and tone for age. Extremities: Warm and well perfused. No clubbing, cyanosis.  No peripheral edema.  Neuro: Alert and oriented X 3. Psych:  Answers questions appropriately.   Labs:   Lab Results  Component Value Date   WBC 8.2 07/22/2022   HGB 14.6 07/22/2022   HCT 41.1 07/22/2022   MCV 85.3 07/22/2022   PLT 333 07/22/2022    Recent Labs  Lab 07/23/22 0531  NA 135  K 4.3  CL 102  CO2 26  BUN 14  CREATININE 0.64  CALCIUM 8.0*  GLUCOSE 97    Lab Results  Component Value Date   TROPONINI <0.03 08/11/2017    Lab Results  Component Value Date   CHOL 191 07/23/2022   Lab Results  Component Value Date   HDL 54 07/23/2022   Lab Results  Component Value Date   LDLCALC 121 (H) 07/23/2022   Lab Results  Component Value Date   TRIG 82 07/23/2022   Lab Results  Component Value Date   CHOLHDL 3.5 07/23/2022   No results found for: "LDLDIRECT"    Radiology: ECHOCARDIOGRAM COMPLETE  Result Date: 07/23/2022    ECHOCARDIOGRAM REPORT   Patient Name:   Kimberly Page Date of Exam: 07/22/2022 Medical Rec #:  [id number]        Height:       64.0 in Accession #:    [id number]       Weight:       149.9 lb Date of Birth:  08/21/1946         BSA:          1.731 m Patient Age:    71 years         BP:           104/68 mmHg Patient Gender: F                HR:           58 bpm. Exam Location:  [facility] Procedure: 2D Echo, Cardiac Doppler and Color Doppler Indications:     R07.9 Chest pain  History:         Patient has no prior history of Echocardiogram  examinations.                  COPD, Arrythmias:Atrial Fibrillation; Risk                  Factors:Dyslipidemia, Former Smoker and Hypertension.  Sonographer:     Rosalia Hammers Referring Phys:  [id number] [place] TANG Diagnosing Phys: Yolonda Kida MD  Sonographer Comments: Suboptimal parasternal window. Image acquisition challenging due to COPD. IMPRESSIONS  1. Left ventricular ejection fraction, by estimation, is 65 to 70%. The left ventricle has normal function. The left ventricle has no regional wall motion abnormalities. There is mild concentric left ventricular hypertrophy. Left ventricular diastolic parameters are consistent with Grade I diastolic dysfunction (impaired relaxation).  2. Right ventricular systolic function is normal. The right ventricular size is normal. Mildly increased right ventricular wall thickness.  3. The mitral valve is normal in structure. Mild mitral valve regurgitation.  4. The aortic valve is grossly normal. Aortic valve regurgitation is not visualized. Conclusion(s)/Recommendation(s): Poor windows for evaluation of left ventricular function by transthoracic echocardiography. Would recommend an alternative means of evaluation. FINDINGS  Left Ventricle: Left ventricular ejection fraction, by estimation, is 65 to 70%. The left ventricle has normal function. The left ventricle has no  regional wall motion abnormalities. The left ventricular internal cavity size was normal in size. There is  mild concentric left ventricular hypertrophy. Left ventricular diastolic parameters are consistent with Grade I diastolic dysfunction (impaired relaxation). Right Ventricle: The right ventricular size is normal. Mildly increased right ventricular wall thickness. Right ventricular systolic function is normal. Left Atrium: Left atrial size was normal in size. Right Atrium: Right atrial size was normal in size. Pericardium: There is no evidence of pericardial effusion. Mitral Valve: The mitral  valve is normal in structure. Mild mitral valve regurgitation. Tricuspid Valve: The tricuspid valve is normal in structure. Tricuspid valve regurgitation is mild. Aortic Valve: The aortic valve is grossly normal. Aortic valve regurgitation is not visualized. Aortic valve mean gradient measures 5.0 mmHg. Aortic valve peak gradient measures 9.6 mmHg. Aortic valve area, by VTI measures 2.26 cm. Pulmonic Valve: The pulmonic valve was grossly normal. Pulmonic valve regurgitation is not visualized. Aorta: The ascending aorta was not well visualized. IAS/Shunts: No atrial level shunt detected by color flow Doppler.  LEFT VENTRICLE PLAX 2D LVIDd:         3.68 cm   Diastology LVIDs:         2.33 cm   LV e' medial:    7.29 cm/s LV PW:         1.25 cm   LV E/e' medial:  9.9 LV IVS:        1.22 cm   LV e' lateral:   7.72 cm/s LVOT diam:     1.80 cm   LV E/e' lateral: 9.3 LV SV:         62 LV SV Index:   36 LVOT Area:     2.54 cm  RIGHT VENTRICLE RV Basal diam:  3.37 cm RV S prime:     13.20 cm/s LEFT ATRIUM             Index        RIGHT ATRIUM           Index LA diam:        3.30 cm 1.91 cm/m   RA Area:     12.10 cm LA Vol (A2C):   28.7 ml 16.58 ml/m  RA Volume:   26.50 ml  15.31 ml/m LA Vol (A4C):   23.9 ml 13.81 ml/m LA Biplane Vol: 27.2 ml 15.72 ml/m  AORTIC VALVE AV Area (Vmax):    1.94 cm AV Area (Vmean):   2.00 cm AV Area (VTI):     2.26 cm AV Vmax:           155.00 cm/s AV Vmean:          100.000 cm/s AV VTI:            0.273 m AV Peak Grad:      9.6 mmHg AV Mean Grad:      5.0 mmHg LVOT Vmax:         118.00 cm/s LVOT Vmean:        78.400 cm/s LVOT VTI:          0.242 m LVOT/AV VTI ratio: 0.89  AORTA Ao Root diam: 3.10 cm MITRAL VALVE MV Area (PHT): 3.42 cm    SHUNTS MV Decel Time: 222 msec    Systemic VTI:  0.24 m MV E velocity: 72.00 cm/s  Systemic Diam: 1.80 cm MV A velocity: 93.00 cm/s MV E/A ratio:  0.77 Kimberly Turbyfill D Kazuo Durnil MD Electronically signed by Yolonda Kida MD Signature Date/Time:  07/23/2022/4:18:40 PM    Final    CT HEAD WO CONTRAST (5MM)  Result Date: 07/22/2022 CLINICAL DATA:  Sudden onset headaches 4 hours ago EXAM: CT HEAD WITHOUT CONTRAST TECHNIQUE: Contiguous axial images were obtained from the base of the skull through the vertex without intravenous contrast. RADIATION DOSE REDUCTION: This exam was performed according to the departmental dose-optimization program which includes automated exposure control, adjustment of the mA and/or kV according to patient size and/or use of iterative reconstruction technique. COMPARISON:  08/08/2020 FINDINGS: Brain: No evidence of acute infarction, hemorrhage, hydrocephalus, extra-axial collection or mass lesion/mass effect. Vascular: No hyperdense vessel or unexpected calcification. Skull: Normal. Negative for fracture or focal lesion. Sinuses/Orbits: No acute finding. Other: None. IMPRESSION: No acute intracranial abnormality noted. Electronically Signed   By: Inez Catalina M.D.   On: 07/22/2022 01:25   DG Chest Port 1 View  Result Date: 07/22/2022 CLINICAL DATA:  Chest pain and shortness of breath for several hours EXAM: PORTABLE CHEST 1 VIEW COMPARISON:  06/27/2022 FINDINGS: Cardiac shadow is within normal limits. Aortic calcifications are noted. Lungs are well aerated bilaterally. No bony abnormality is seen. IMPRESSION: No acute abnormality noted. Electronically Signed   By: Inez Catalina M.D.   On: 07/22/2022 01:11   DG Chest 2 View  Result Date: 06/27/2022 CLINICAL DATA:  Chest pain. EXAM: CHEST - 2 VIEW COMPARISON:  08/08/2020. FINDINGS: Chronic hyperinflation. No consolidation. No visible pleural effusions pneumothorax per cardiomediastinal silhouette is unchanged. Cholecystectomy clips. IMPRESSION: Chronic hyperinflation. No evidence of acute cardiopulmonary disease. Electronically Signed   By: Margaretha Sheffield M.D.   On: 06/27/2022 12:41    12/02/2018 CTA heart with contrast  Comparison: None   Technique:  Contrast enhanced  Prospective ECG gated CTA of the heart was  performed following the administration of IV contrast. If IV contrast  material had not been administered, the likelihood of detecting  abnormalities relevant to the patient's condition would have been  substantially decreased.  3-D volumetric and curved planar reformats were  likewise performed as indicated to increase the sensitivity of detecting  clinically relevant pathology.   Patient specific Information:  Blood Pressure at closest time to scan: 120/61  Heart rate during exam: 88  Heart rhythm during exam: Regular  Beta-blockers administered: Patient took 50 mg metoprolol by mouth at time  at 0515 hours  Nitroglycerin administered: 0.8 mg.   Limitations of study if any: None.   Findings:  Coronary Arteries:  There is right dominance of the coronary arterial circulation.  The  coronary artery ostia are in normal location.   Left Main:  Mild calcified plaque at the origin without significant stenosis.   Left Anterior Descending: No significant atherosclerotic disease or  stenosis   Diagonals: D1 and D2 patent.   Left Circumflex: Minimal calcified plaque in the proximal left circumflex  without significant stenosis. Small amount of calcified plaque in the mid  LAD causing less than 25% stenosis.   Obtuse Marginals: No significant atherosclerotic largest obtuse marginal  branch without significant stenosis.   Right Coronary Artery: No significant atherosclerotic disease or stenosis.   Chamber Morphology and Function:  Mild left atrial enlargement. No left atrial thrombus. Left atrial  appendage without thrombus. Left ventricle normal in size without thrombus.   Mitral valve leaflet mildly thickened.  Logically tricuspid aortic valve. Mild thickening of the valve leaflets.     CT Chest:  Minimal atelectasis and bronchiectasis in the right middle lobe and  lingula. Bilateral pulmonary nodules, the  largest nodule in the  right lower  lobe (series 10, image 98) measuring 1.2 x 1.0 cm.   No pericardial effusion. Minimal atherosclerotic disease in the visualized  portion of the thoracic aorta without aneurysm. No lymphadenopathy in the  visualized portion of the mediastinum. Partially visualized moderate hiatal  hernia. No destructive osseous lesion.     Impression:  1. Normal origin and course of coronary arteries  2. Minimal scattered atherosclerotic disease with luminal narrowing under  25% . CAD RADS 1.  3. Bilateral pulmonary nodules in the partially visualized lung, the  largest in the right lower lobe measuring average diameter of 1.1 cm.  Recommend follow-up CT chest in 3 months or PET/CT.       CAD-RADS Reporting and Data System for patients presenting with stable  chest pain (vessels greater than 1.5 mm diameter)  Category                 Degree of maximal coronary stenosis                       Interpretation  CAD-RADS 0               0 (no plaque or stenosis)                                 No CAD present  CAD-RADS 1               1-24% (minimal stenosis or plaque with no  stenosis)       Minimal non-obstructive CAD  CAD-RADS 2               25-49% mild luminal stenosis                               Mild non-obstructive CAD  CAD-RADS 3               50-69% moderate luminal stenosis                           Moderate Stenosis  CAD-RADS 4A              70-99% severe luminal stenosis                             Severe Stenosis  CAD-RADS 4B              left main > 50% or 3 vessel obstructive (>70%  disease     Severe Stenosis  CAD-RADS 5               100% (total occlusion)                                     Total coronary occlusion  CAD-RADS N               non-diagnostic                                             Obstructive CAD can not be excluded   Modifiers:  N: non-diagnostic  S: stent  G: graft  V: vulnerable   Electronically Reviewed by:  Tyrell Antonio, MD, [facility]  Electronically Reviewed on:  12/02/2018 12:12 PM   I have reviewed the images and concur with the above findings.   Electronically Signed by:  Burns Spain, MD, [facility]  Electronically Signed on:  12/02/2018 12:15 PM  11/11/2018 Impression  Lexiscan stress.  LV function normal.  Small area of mild anterior  hypoperfusion with stress with partial redistribution with rest.  Moderate  risk study.  Clinical correlation recommended. Narrative  CARDIOLOGY DEPARTMENT  [facility]  [facility]  [place], [place], [place]  [address]  [phone]   Procedure: Pharmacologic Myocardial Perfusion Imaging    ONE day procedure   Indication: Chest pain at rest, unspecified  Plan: NM myocardial perfusion SPECT multiple (stress        and rest), ECG stress test only   Ordering Physician:   Dr. Bartholome Bill    Clinical History:  76 y.o. year old female  Vitals: Height: 23 in Weight: 179 lb  Cardiac risk factors include:     PAT, COPD and Hyperlipidemia    Procedure:   Pharmacologic stress testing was performed with Regadenoson using a single  use 0.'4mg'$ /55m (0.08 mg/ml) prefilled syringe intravenously infused as a  bolus dose. The stress test was stopped due to Infusion completion.  Blood  pressure response was normal. The patient did not develop any symptoms  other than fatigue during the procedure.   Rest HR: 55bpm  Rest BP: 116/79mg  Max HR: 82bpm  Min BP: 116/7213m   Stress Test Administered by: DINOswald HillockMA   ECG Interpretation:  Rest ECG:  normal sinus rhythm, none  Stress ECG:  normal sinus rhythm, no arrhythmia or ischemia  Recovery ECG:  normal sinus rhythm  ECG Interpretation:  non-diagnostic due to pharmacologic testing.    Administrations This Visit     regadenoson (LEXISCAN) 0.4 mg/5 mL inj syringe 0.4 mg     Admin Date  11/10/2018 Action  Given Dose  0.4 mg Route  Intravenous Administered By  GoaHerbert SetaNMT     technetium [id number] (CARDIOLITE) injection 12.187.8licurie     Admin Date  11/10/2018 Action  Given Dose  12.167.6licurie Route  Intravenous Administered By  GoarHerbert SetaMT   Gated post-stress perfusion imaging was performed 30 minutes after stress.  Rest images were performed 30 minutes after injection.   Gated LV Analysis:  TID Ratio: 1.07   LVEF= 64%   FINDINGS:  Regional wall motion:  reveals normal myocardial thickening and wall  motion.  The overall quality of the study is good.    Artifacts noted: no  Left ventricular cavity: normal.   Perfusion Analysis:  SPECT images demonstrate small perfusion abnormality  of mild intensity is present in the anterior region on the stress images.    ECHO 09/17/2017  STRESS ECHOCARDIOGRAPHY            Protocol:Treadmill (Bruce)              Drugs:None  Target Heart Rate:128 bpm        Maximum Predicted Heart Rate: 150 bpm   +-------------------+-------------------------+-------------------------+------------+         Stage            Duration (mm:ss)         Heart Rate (bpm)  BP       +-------------------+-------------------------+-------------------------+------------+        RESTING                                           55              130/73     +-------------------+-------------------------+-------------------------+------------+        EXERCISE                3:56                     136                /        +-------------------+-------------------------+-------------------------+------------+        RECOVERY                4:43                      73              147/76     +-------------------+-------------------------+-------------------------+------------+     Stress Duration:3:56 mm:ss    Max Stress H.R.:136 bpm        Target Heart Rate Achieved: Yes   ___________________________________________________________________________________________   WALL  SEGMENT CHANGES                         Rest          Stress  Anterior Septum Basal:Normal        Hyperkinetic                    DXI:PJASNK        Hyperkinetic                 Apical:Normal        Hyperkinetic     Anterior Wall Basal:Normal        Hyperkinetic                    NLZ:JQBHAL        Hyperkinetic                 Apical:Normal        Hyperkinetic      Lateral Wall Basal:Normal        Hyperkinetic                    PFX:TKWIOX        Hyperkinetic                 Apical:Normal        Hyperkinetic    Posterior Wall Basal:Normal        Hyperkinetic                    BDZ:HGDJME        Hyperkinetic     Inferior Wall Basal:Normal        Hyperkinetic                    QAS:TMHDQQ        Hyperkinetic                 Apical:Normal        Hyperkinetic   Inferior Septum Basal:Normal  Hyperkinetic                    QPY:PPJKDT        Hyperkinetic              Resting EF:>55% (Est.)   Stress EF: >55% (Est.)   ___________________________________________________________________________________________   ADDITIONAL FINDINGS    ___________________________________________________________________________________________   STRESS ECG RESULTS                                                ECG Results:Normal   ___________________________________________________________________________________________  ECHOCARDIOGRAPHIC DESCRIPTIONS   LEFT VENTRICLE         Size:Normal  Contraction:Normal    LV Masses:No Masses          OIZ:TIWP   RIGHT VENTRICLE         Size:Normal                Free Wall:Normal  Contraction:Normal                RV Masses:No mass   PERICARDIUM        Fluid:No effusion    _______________________________________________________________________________________  DOPPLER ECHO and OTHER SPECIAL PROCEDURES     Aortic:No AR                      No AS      Mitral:TRIVIAL MR                 No MS            MV Inflow E Vel=nm*        MV Annulus  E'Vel=nm*            E/E'Ratio=nm*   Tricuspid:TRIVIAL TR                 No TS            190.0 cm/sec peak TR vel   19.4 mmHg peak RV pressure   Pulmonary:No PR                      No PS     ___________________________________________________________________________________________  ECHOCARDIOGRAPHIC MEASUREMENTS  2D DIMENSIONS  AORTA             Values      Normal Range      MAIN PA          Values      Normal Range            Annulus:  nm*       [2.1 - 2.5]                PA Main:  nm*       [1.5 - 2.1]          Aorta Sin:  nm*       [2.7 - 3.3]       RIGHT VENTRICLE        ST Junction:  nm*       [2.3 - 2.9]                RV Base:  nm*       [ < 4.2]          Asc.Aorta:  nm*       [2.3 - 3.1]  RV Mid:  nm*       [ < 3.5]   LEFT VENTRICLE                                        RV Length:  nm*       [ < 8.6]              LVIDd:  nm*       [3.9 - 5.3]       INFERIOR VENA CAVA              LVIDs:  nm*                                 Max. IVC:  nm*       [ <= 2.1]                 FS:  nm*       [> 25]                    Min. IVC:  nm*                SWT:  nm*       [0.5 - 0.9]                   ------------------                PWT:  nm*       [0.5 - 0.9]                   nm* - not measured   LEFT ATRIUM            LA Diam:  nm*       [2.7 - 3.8]        LA A4C Area:  nm*       [ < 20]          LA Volume:  nm*       [22 - 52]    ___________________________________________________________________________________________  INTERPRETATION  Normal Stress Echocardiogram  NORMAL RIGHT VENTRICULAR SYSTOLIC FUNCTION  TRIVIAL REGURGITATION NOTED (See above)  NO VALVULAR STENOSIS NOTED  Resting EF: >55% (Est.)  Post Stress EF:>55% (Est.)  ECG Results: Normal  Mitral: TRIVIAL MR  Tricuspid: TRIVIAL TR   TELEMETRY reviewed by me: Sinus bradycardia with rate in the high 50s to sinus rhythm with rate in the 60s to 70s.  EKG reviewed by me: sinus rhythm rate 92 with TWI  in aVL without other ischemic changes  ASSESSMENT AND PLAN:  Kimberly Page is a 76yoF with a PMH of paroxysmal atrial tachycardia (off AC d/t epistaxis), hyperlipidemia, atypical chest pain, COPD/reactive airway disease, history of tobacco use, who presents to [facility] ED in the early morning hours of 07/22/2022 with generalized malaise, weakness, and a headache with poor p.o. intake for several days.  Cardiology is consulted for further assistance with her chest discomfort.  #Dehydration #Palpitations with history of paroxysmal atrial tachycardia #Chest tightness #Elevated troponin The patient presents with 2 to 3 days of poor p.o. intake and nausea and several brief episodes of heart racing and associated chest tightness, was found to be fairly hyponatremic sodium 123, and was recently started on lisinopril-HCTZ 12.5 approximately 10 days ago by her  PCP for better blood pressure control.  She has had ongoing issues with episodes of heart racing over the past month, in the setting of GI illness/food poisoning and recently had her metoprolol increased to 37.5 mg twice daily which had seemed to help her symptoms up until 2 to 3 days ago.  Her EKG shows sinus rhythm with a TWI in aVL without other ischemic changes and her troponin is slightly elevated trending 21-55-87 and downtrended thereafter to [id number]  At my time of evaluation on day of admission, the patient feels better, denies further episodes of heart racing or chest tightness since she has been in the ER. She has been in sinus rhythm in the 60s-70s on telemetry. I suspect that her heart racing is primarily in the setting of dehydration and starting the thiazide diuretic contributing to her hyponatremia, troponin elevation is likely demand in the setting. -Agree with current therapy with IV fluid resuscitation -troponin peaked at 87  -S/p 324 mg aspirin, continue 81 mg aspirin daily -discussed continuing atorvastatin as her LDL is above goal at  121, patient prefers to discuss this further with her PCP before continuing  -Continue metoprolol XL 37.5 mg twice daily -Continue lisinopril 10 mg, discontinue HCTZ and do not restart a discharge. -echocardiogram complete resulted above with preserved LVEF.  -ok for discharge today from a cardiac standpoint, follow up with Dr. Ubaldo Glassing in 1-2 weeks.   This patient's plan of care was discussed and created with Dr. Clayborn Bigness and he is in agreement.  Signed: Tristan Schroeder , PA-C 07/24/2022, 8:42 AM [facility]

## 2022-07-24 NOTE — Discharge Instructions (Signed)
Advised to follow-up with cardiology Dr. Ubaldo Glassing in 2 weeks. Advised to take Lipitor 40 mg daily for hypercholesterolemia. Advised to discontinue lisinopril HCTZ, switch to lisinopril 10 mg daily. Advised to avoid HCTZ in future given hyponatremia. Advised to take metoprolol 37.2 mg twice daily.

## 2022-07-24 NOTE — Plan of Care (Signed)

## 2022-07-24 NOTE — Discharge Summary (Signed)
Physician Discharge Summary  Kimberly Page PYK:[id number] DOB: Apr 27, 1946 DOA: 07/22/2022  PCP: Rusty Aus, MD  Admit date: 07/22/2022  Discharge date: 07/24/2022  Admitted From: Home. Disposition:  Home  Recommendations for Outpatient Follow-up:  Follow up with PCP in 1-2 weeks Please obtain BMP/CBC in one week Advised to follow-up with cardiology Dr. Ubaldo Glassing in 2 weeks. Advised to take Lipitor 40 mg daily for hypercholesterolemia. Advised to discontinue lisinopril HCTZ,  and switch to lisinopril 10 mg daily. Advised to avoid HCTZ in future given hyponatremia. Advised to take metoprolol 37.2 mg twice daily.  Home Health:None Equipment/Devices:None  Discharge Condition: Stable CODE STATUS:Full code Diet recommendation: Heart Healthy  Brief [facility] Course: This 76 years old female with PMH significant for hypertension, hyperlipidemia, COPD, A-fib not on anticoagulation due to history of epistaxis, GERD, former smoker, hiatal hernia, s/p repair of paraesophageal hernia presented in the ED with complaints of chest pain associated with palpitations since last night.  Patient states that her PCP has adjusted her metoprolol dose, she describes chest pain is pressure-like, nonradiating, midsternal.  Patient was admitted for chest pain and ruled out ACS.  Cardiology is consulted, echocardiogram completed, report shows LVEF 55 to 60%, no RWMA.  Patient did have a CTA chest in 2019 which showed normal coronary arteries.  As per cardiology chest pain appears atypical and can be discharged and follow-up 2 weeks with Dr. Ubaldo Glassing.  Patient feels better denies any chest pain.  Patient wants to be discharged.  Patient is being discharged home and follow-up plan with Dr. Ubaldo Glassing in 2 weeks.  Medication adjusted during hospitalization.  Discharge medications: Lipitor 40 mg daily Lisinopril 10 mg daily. Metoprolol 37.2 mg twice daily. Aspirin 81 mg daily Discontinue HCTZ given hyponatremia.    Discharge Diagnoses:  Principal Problem:   NSTEMI (non-ST elevated myocardial infarction) ([place]) Active Problems:   HTN (hypertension)   HLD (hyperlipidemia)   COPD (chronic obstructive pulmonary disease) (HCC)   PAF (paroxysmal atrial fibrillation) (HCC)   Hyponatremia   Diarrhea  Chest pain: Patient presented with chest pain,  nonradiating, pressure-like associated with palpitations. Troponin level 21, 55, 87,  Consulted Dr. Nehemiah Massed of cardiology Continue aspirin, Lipitor. As needed nitroglycerin and morphine Echocardiogram completed, LVEF 55 to 60% no RWMA Chest pain has resolved, appears atypical. Patient did have CTA chest in 2019 which showed normal coronary arteries. Chest pain resolved.  Patient wants to be discharged.   Essential hypertension: Continue IV hydralazine as needed. -Hold HCTZ due to hyponatremia -Continue home lisinopril, metoprolol   HLD (hyperlipidemia) Continue Lipitor   COPD (chronic obstructive pulmonary disease) (HCC) Stable. Continue Bronchodilators   PAF (paroxysmal atrial fibrillation) ([place]) Patient is not taking anticoagulants. Continue Metoprolol   Hyponatremia > Resolved  Sodium 123, which improved to 135 with iv fluids. -Check osmolality of plasma and urine, and urine sodium -Hold HCTZ   Diarrhea C. difficile and GI panel negative. Diarrhea has resolved.  Discharge Instructions  Discharge Instructions     Call MD for:  difficulty breathing, headache or visual disturbances   Complete by: As directed    Call MD for:  persistant dizziness or light-headedness   Complete by: As directed    Call MD for:  persistant nausea and vomiting   Complete by: As directed    Diet - low sodium heart healthy   Complete by: As directed    Diet Carb Modified   Complete by: As directed    Discharge instructions   Complete by: As  directed    Advised to follow-up with primary care physician in 1 week. Advised to follow-up with cardiology Dr.  Ubaldo Glassing in 2 weeks. Advised to take Lipitor 40 mg daily for hypercholesterolemia. Advised to discontinue lisinopril HCTZ, switch to lisinopril 10 mg daily. Advised to avoid HCTZ in future given hyponatremia. Advised to take metoprolol 37.2 mg twice daily.   Increase activity slowly   Complete by: As directed       Allergies as of 07/24/2022       Reactions   Naproxen Other (See Comments)   passed out. 50 years ago   Citalopram    Unknown   Propoxyphene Other (See Comments)   Unknown   Verapamil    Unknown   Pseudoephedrine Hcl Palpitations        Medication List     STOP taking these medications    lisinopril-hydrochlorothiazide 10-12.5 MG tablet Commonly known as: ZESTORETIC       TAKE these medications    acetaminophen 500 MG tablet Commonly known as: TYLENOL Take 500 mg by mouth every 6 (six) hours as needed for moderate pain or headache.   Advair HFA 115-21 MCG/ACT inhaler Generic drug: fluticasone-salmeterol SMARTSIG:2 Puff(s) Via Inhaler Every 12 Hours   albuterol 108 (90 Base) MCG/ACT inhaler Commonly known as: VENTOLIN HFA Inhale 2 puffs into the lungs every 6 (six) hours as needed for wheezing or shortness of breath.   aspirin 81 MG tablet Take 81 mg by mouth daily.   atorvastatin 40 MG tablet Commonly known as: LIPITOR Take 1 tablet (40 mg total) by mouth daily. Start taking on: July 25, 2022   CALCIUM 600+D3 PO Take 1 tablet by mouth 2 (two) times a day.   EYE DROPS ADVANCED RELIEF OP Place 1 drop into both eyes daily as needed (for dry eyes).   fluticasone 50 MCG/ACT nasal spray Commonly known as: FLONASE Place 2 sprays into both nostrils daily.   lisinopril 10 MG tablet Commonly known as: ZESTRIL Take 1 tablet (10 mg total) by mouth daily. Start taking on: July 25, 2022   loratadine 10 MG tablet Commonly known as: CLARITIN Take 10 mg by mouth every morning.   Magnesium 500 MG Tabs Take 500 mg by mouth every morning.    metoprolol succinate 25 MG 24 hr tablet Commonly known as: TOPROL-XL Take 1.5 tablets (37.5 mg total) by mouth 2 (two) times daily. What changed: when to take this   montelukast 10 MG tablet Commonly known as: SINGULAIR Take 10 mg by mouth daily.   oxybutynin 5 MG tablet Commonly known as: DITROPAN Take 5 mg by mouth 2 (two) times daily.   VITAMIN B-12 PO Place 1 tablet under the tongue daily.   vitamin E 200 UNIT capsule Take 200 Units by mouth 4 (four) times a week. Takes Sundays, Mondays, Tuesdays and Wednesdays.        Follow-up Information     Teodoro Spray, MD. Go in 1 week(s).   Specialty: Cardiology Why: Appointment on tuesday 07/29/2022 at 10:45am. Contact information: [place] [address] [phone]         Rusty Aus, MD. Go in 1 week(s).   Specialty: Internal Medicine Why: Appointment on Friday, 08/01/2022 at 10:15am Contact information: [place] [address] [phone]                Allergies  Allergen Reactions   Naproxen Other (See Comments)  passed out. 50 years ago   Citalopram     Unknown   Propoxyphene Other (See Comments)    Unknown   Verapamil     Unknown   Pseudoephedrine Hcl Palpitations    Consultations: Cardiology   Procedures/Studies: ECHOCARDIOGRAM COMPLETE  Result Date: 07/23/2022    ECHOCARDIOGRAM REPORT   Patient Name:   Kimberly Page Date of Exam: 07/22/2022 Medical Rec #:  [id number]        Height:       64.0 in Accession #:    [id number]       Weight:       149.9 lb Date of Birth:  25-Apr-1946         BSA:          1.731 m Patient Age:    87 years         BP:           104/68 mmHg Patient Gender: F                HR:           58 bpm. Exam Location:  [facility] Procedure: 2D Echo, Cardiac Doppler and Color Doppler Indications:     R07.9 Chest pain  History:         Patient has no prior history of Echocardiogram examinations.                   COPD, Arrythmias:Atrial Fibrillation; Risk                  Factors:Dyslipidemia, Former Smoker and Hypertension.  Sonographer:     Rosalia Hammers Referring Phys:  [id number] [place] TANG Diagnosing Phys: Yolonda Kida MD  Sonographer Comments: Suboptimal parasternal window. Image acquisition challenging due to COPD. IMPRESSIONS  1. Left ventricular ejection fraction, by estimation, is 65 to 70%. The left ventricle has normal function. The left ventricle has no regional wall motion abnormalities. There is mild concentric left ventricular hypertrophy. Left ventricular diastolic parameters are consistent with Grade I diastolic dysfunction (impaired relaxation).  2. Right ventricular systolic function is normal. The right ventricular size is normal. Mildly increased right ventricular wall thickness.  3. The mitral valve is normal in structure. Mild mitral valve regurgitation.  4. The aortic valve is grossly normal. Aortic valve regurgitation is not visualized. Conclusion(s)/Recommendation(s): Poor windows for evaluation of left ventricular function by transthoracic echocardiography. Would recommend an alternative means of evaluation. FINDINGS  Left Ventricle: Left ventricular ejection fraction, by estimation, is 65 to 70%. The left ventricle has normal function. The left ventricle has no regional wall motion abnormalities. The left ventricular internal cavity size was normal in size. There is  mild concentric left ventricular hypertrophy. Left ventricular diastolic parameters are consistent with Grade I diastolic dysfunction (impaired relaxation). Right Ventricle: The right ventricular size is normal. Mildly increased right ventricular wall thickness. Right ventricular systolic function is normal. Left Atrium: Left atrial size was normal in size. Right Atrium: Right atrial size was normal in size. Pericardium: There is no evidence of pericardial effusion. Mitral Valve: The mitral valve is normal in structure.  Mild mitral valve regurgitation. Tricuspid Valve: The tricuspid valve is normal in structure. Tricuspid valve regurgitation is mild. Aortic Valve: The aortic valve is grossly normal. Aortic valve regurgitation is not visualized. Aortic valve mean gradient measures 5.0 mmHg. Aortic valve peak gradient measures 9.6 mmHg. Aortic valve area, by VTI measures 2.26 cm. Pulmonic Valve: The pulmonic valve was grossly  normal. Pulmonic valve regurgitation is not visualized. Aorta: The ascending aorta was not well visualized. IAS/Shunts: No atrial level shunt detected by color flow Doppler.  LEFT VENTRICLE PLAX 2D LVIDd:         3.68 cm   Diastology LVIDs:         2.33 cm   LV e' medial:    7.29 cm/s LV PW:         1.25 cm   LV E/e' medial:  9.9 LV IVS:        1.22 cm   LV e' lateral:   7.72 cm/s LVOT diam:     1.80 cm   LV E/e' lateral: 9.3 LV SV:         62 LV SV Index:   36 LVOT Area:     2.54 cm  RIGHT VENTRICLE RV Basal diam:  3.37 cm RV S prime:     13.20 cm/s LEFT ATRIUM             Index        RIGHT ATRIUM           Index LA diam:        3.30 cm 1.91 cm/m   RA Area:     12.10 cm LA Vol (A2C):   28.7 ml 16.58 ml/m  RA Volume:   26.50 ml  15.31 ml/m LA Vol (A4C):   23.9 ml 13.81 ml/m LA Biplane Vol: 27.2 ml 15.72 ml/m  AORTIC VALVE AV Area (Vmax):    1.94 cm AV Area (Vmean):   2.00 cm AV Area (VTI):     2.26 cm AV Vmax:           155.00 cm/s AV Vmean:          100.000 cm/s AV VTI:            0.273 m AV Peak Grad:      9.6 mmHg AV Mean Grad:      5.0 mmHg LVOT Vmax:         118.00 cm/s LVOT Vmean:        78.400 cm/s LVOT VTI:          0.242 m LVOT/AV VTI ratio: 0.89  AORTA Ao Root diam: 3.10 cm MITRAL VALVE MV Area (PHT): 3.42 cm    SHUNTS MV Decel Time: 222 msec    Systemic VTI:  0.24 m MV E velocity: 72.00 cm/s  Systemic Diam: 1.80 cm MV A velocity: 93.00 cm/s MV E/A ratio:  0.77 Dwayne D Callwood MD Electronically signed by Yolonda Kida MD Signature Date/Time: 07/23/2022/4:18:40 PM    Final    CT HEAD  WO CONTRAST (5MM)  Result Date: 07/22/2022 CLINICAL DATA:  Sudden onset headaches 4 hours ago EXAM: CT HEAD WITHOUT CONTRAST TECHNIQUE: Contiguous axial images were obtained from the base of the skull through the vertex without intravenous contrast. RADIATION DOSE REDUCTION: This exam was performed according to the departmental dose-optimization program which includes automated exposure control, adjustment of the mA and/or kV according to patient size and/or use of iterative reconstruction technique. COMPARISON:  08/08/2020 FINDINGS: Brain: No evidence of acute infarction, hemorrhage, hydrocephalus, extra-axial collection or mass lesion/mass effect. Vascular: No hyperdense vessel or unexpected calcification. Skull: Normal. Negative for fracture or focal lesion. Sinuses/Orbits: No acute finding. Other: None. IMPRESSION: No acute intracranial abnormality noted. Electronically Signed   By: Inez Catalina M.D.   On: 07/22/2022 01:25   DG Chest [facility] 1 View  Result  Date: 07/22/2022 CLINICAL DATA:  Chest pain and shortness of breath for several hours EXAM: PORTABLE CHEST 1 VIEW COMPARISON:  06/27/2022 FINDINGS: Cardiac shadow is within normal limits. Aortic calcifications are noted. Lungs are well aerated bilaterally. No bony abnormality is seen. IMPRESSION: No acute abnormality noted. Electronically Signed   By: Inez Catalina M.D.   On: 07/22/2022 01:11   DG Chest 2 View  Result Date: 06/27/2022 CLINICAL DATA:  Chest pain. EXAM: CHEST - 2 VIEW COMPARISON:  08/08/2020. FINDINGS: Chronic hyperinflation. No consolidation. No visible pleural effusions pneumothorax per cardiomediastinal silhouette is unchanged. Cholecystectomy clips. IMPRESSION: Chronic hyperinflation. No evidence of acute cardiopulmonary disease. Electronically Signed   By: Margaretha Sheffield M.D.   On: 06/27/2022 12:41     Subjective: Patient was seen and examined at bedside.  Overnight events noted.  Patient reports feeling better and want to be  discharged.   Patient being discharged home.  Discharge Exam: Vitals:   07/24/22 0755 07/24/22 1151  BP: (!) 141/82 (!) 148/74  Pulse: 60 61  Resp: 18 19  Temp: 98.2 F (36.8 C) 98 F (36.7 C)  SpO2: 99% 100%   Vitals:   07/23/22 2324 07/24/22 0354 07/24/22 0755 07/24/22 1151  BP: 133/65 (!) 155/75 (!) 141/82 (!) 148/74  Pulse: 61 (!) 55 60 61  Resp: '20 20 18 19  '$ Temp: 97.8 F (36.6 C) 98 F (36.7 C) 98.2 F (36.8 C) 98 F (36.7 C)  TempSrc: Oral Oral Oral Oral  SpO2: 99% 100% 99% 100%  Weight:      Height:        General: Pt is alert, awake, not in acute distress Cardiovascular: RRR, S1/S2 +, no rubs, no gallops Respiratory: CTA bilaterally, no wheezing, no rhonchi Abdominal: Soft, NT, ND, bowel sounds + Extremities: no edema, no cyanosis    The results of significant diagnostics from this hospitalization (including imaging, microbiology, ancillary and laboratory) are listed below for reference.     Microbiology: Recent Results (from the past 240 hour(s))  SARS Coronavirus 2 by RT PCR (hospital order, performed in [facility] hospital lab) *cepheid single result test* Anterior Nasal Swab     Status: None   Collection Time: 07/22/22  1:51 AM   Specimen: Anterior Nasal Swab  Result Value Ref Range Status   SARS Coronavirus 2 by RT PCR NEGATIVE NEGATIVE Final    Comment: (NOTE) SARS-CoV-2 target nucleic acids are NOT DETECTED.  The SARS-CoV-2 RNA is generally detectable in upper and lower respiratory specimens during the acute phase of infection. The lowest concentration of SARS-CoV-2 viral copies this assay can detect is 250 copies / mL. A negative result does not preclude SARS-CoV-2 infection and should not be used as the sole basis for treatment or other patient management decisions.  A negative result may occur with improper specimen collection / handling, submission of specimen other than nasopharyngeal swab, presence of viral mutation(s) within the areas  targeted by this assay, and inadequate number of viral copies (<250 copies / mL). A negative result must be combined with clinical observations, patient history, and epidemiological information.  Fact Sheet for Patients:   https://www.patel.info/  Fact Sheet for Healthcare Providers: https://hall.com/  This test is not yet approved or  cleared by the [place] FDA and has been authorized for detection and/or diagnosis of SARS-CoV-2 by FDA under an Emergency Use Authorization (EUA).  This EUA will remain in effect (meaning this test can be used) for the duration of the COVID-19 declaration under  Section 564(b)(1) of the Act, 21 U.S.C. section 360bbb-3(b)(1), unless the authorization is terminated or revoked sooner.  Performed at [facility], [place], [place] [address]   C Difficile Quick Screen w PCR reflex     Status: None   Collection Time: 07/23/22  9:52 AM   Specimen: STOOL  Result Value Ref Range Status   C Diff antigen NEGATIVE NEGATIVE Final   C Diff toxin NEGATIVE NEGATIVE Final   C Diff interpretation No C. difficile detected.  Final    Comment: Performed at [facility], [address] [place], [place] [address]  Gastrointestinal Panel by PCR , Stool     Status: None   Collection Time: 07/23/22  9:53 AM   Specimen: Stool  Result Value Ref Range Status   Campylobacter species NOT DETECTED NOT DETECTED Final   Plesimonas shigelloides NOT DETECTED NOT DETECTED Final   Salmonella species NOT DETECTED NOT DETECTED Final   Yersinia enterocolitica NOT DETECTED NOT DETECTED Final   Vibrio species NOT DETECTED NOT DETECTED Final   Vibrio cholerae NOT DETECTED NOT DETECTED Final   Enteroaggregative E coli (EAEC) NOT DETECTED NOT DETECTED Final   Enteropathogenic E coli (EPEC) NOT DETECTED NOT DETECTED Final   Enterotoxigenic E coli (ETEC) NOT DETECTED NOT DETECTED Final   Shiga like toxin  producing E coli (STEC) NOT DETECTED NOT DETECTED Final   Shigella/Enteroinvasive E coli (EIEC) NOT DETECTED NOT DETECTED Final   Cryptosporidium NOT DETECTED NOT DETECTED Final   Cyclospora cayetanensis NOT DETECTED NOT DETECTED Final   Entamoeba histolytica NOT DETECTED NOT DETECTED Final   Giardia lamblia NOT DETECTED NOT DETECTED Final   Adenovirus F40/41 NOT DETECTED NOT DETECTED Final   Astrovirus NOT DETECTED NOT DETECTED Final   Norovirus GI/GII NOT DETECTED NOT DETECTED Final   Rotavirus A NOT DETECTED NOT DETECTED Final   Sapovirus (I, II, IV, and V) NOT DETECTED NOT DETECTED Final    Comment: Performed at [facility], [address] [place],  [address]     Labs: BNP (last 3 results) No results for input(s): "BNP" in the last 8760 hours. Basic Metabolic Panel: Recent Labs  Lab 07/22/22 0057 07/22/22 0830 07/22/22 1308 07/23/22 0531  NA 123* 130* 128* 135  K 3.8 3.9 3.9 4.3  CL 87* 96* 95* 102  CO2 '23 26 25 26  '$ GLUCOSE 159* 112* 149* 97  BUN '13 10 10 14  '$ CREATININE 0.74 0.66 0.80 0.64  CALCIUM 9.6 8.5* 8.4* 8.0*   Liver Function Tests: No results for input(s): "AST", "ALT", "ALKPHOS", "BILITOT", "PROT", "ALBUMIN" in the last 168 hours. No results for input(s): "LIPASE", "AMYLASE" in the last 168 hours. No results for input(s): "AMMONIA" in the last 168 hours. CBC: Recent Labs  Lab 07/22/22 0057  WBC 8.2  HGB 14.6  HCT 41.1  MCV 85.3  PLT 333   Cardiac Enzymes: No results for input(s): "CKTOTAL", "CKMB", "CKMBINDEX", "TROPONINI" in the last 168 hours. BNP: Invalid input(s): "POCBNP" CBG: No results for input(s): "GLUCAP" in the last 168 hours. D-Dimer No results for input(s): "DDIMER" in the last 72 hours. Hgb A1c Recent Labs    07/22/22 0057  HGBA1C 5.4   Lipid Profile Recent Labs    07/23/22 0531  CHOL 191  HDL 54  LDLCALC 121*  TRIG 82  CHOLHDL 3.5   Thyroid function studies Recent Labs    07/22/22 0057  TSH  2.305   Anemia work up No results for input(s): "  VITAMINB12", "FOLATE", "FERRITIN", "TIBC", "IRON", "RETICCTPCT" in the last 72 hours. Urinalysis    Component Value Date/Time   COLORURINE STRAW (A) 07/22/2022 0305   APPEARANCEUR CLEAR (A) 07/22/2022 0305   LABSPEC 1.004 (L) 07/22/2022 0305   PHURINE 8.0 07/22/2022 0305   GLUCOSEU NEGATIVE 07/22/2022 0305   HGBUR NEGATIVE 07/22/2022 0305   BILIRUBINUR NEGATIVE 07/22/2022 0305   KETONESUR NEGATIVE 07/22/2022 0305   PROTEINUR NEGATIVE 07/22/2022 0305   NITRITE NEGATIVE 07/22/2022 0305   LEUKOCYTESUR NEGATIVE 07/22/2022 0305   Sepsis Labs Recent Labs  Lab 07/22/22 0057  WBC 8.2   Microbiology Recent Results (from the past 240 hour(s))  SARS Coronavirus 2 by RT PCR (hospital order, performed in [facility] hospital lab) *cepheid single result test* Anterior Nasal Swab     Status: None   Collection Time: 07/22/22  1:51 AM   Specimen: Anterior Nasal Swab  Result Value Ref Range Status   SARS Coronavirus 2 by RT PCR NEGATIVE NEGATIVE Final    Comment: (NOTE) SARS-CoV-2 target nucleic acids are NOT DETECTED.  The SARS-CoV-2 RNA is generally detectable in upper and lower respiratory specimens during the acute phase of infection. The lowest concentration of SARS-CoV-2 viral copies this assay can detect is 250 copies / mL. A negative result does not preclude SARS-CoV-2 infection and should not be used as the sole basis for treatment or other patient management decisions.  A negative result may occur with improper specimen collection / handling, submission of specimen other than nasopharyngeal swab, presence of viral mutation(s) within the areas targeted by this assay, and inadequate number of viral copies (<250 copies / mL). A negative result must be combined with clinical observations, patient history, and epidemiological information.  Fact Sheet for Patients:   https://www.patel.info/  Fact Sheet for  Healthcare Providers: https://hall.com/  This test is not yet approved or  cleared by the [place] FDA and has been authorized for detection and/or diagnosis of SARS-CoV-2 by FDA under an Emergency Use Authorization (EUA).  This EUA will remain in effect (meaning this test can be used) for the duration of the COVID-19 declaration under Section 564(b)(1) of the Act, 21 U.S.C. section 360bbb-3(b)(1), unless the authorization is terminated or revoked sooner.  Performed at [facility], [place], [place] [address]   C Difficile Quick Screen w PCR reflex     Status: None   Collection Time: 07/23/22  9:52 AM   Specimen: STOOL  Result Value Ref Range Status   C Diff antigen NEGATIVE NEGATIVE Final   C Diff toxin NEGATIVE NEGATIVE Final   C Diff interpretation No C. difficile detected.  Final    Comment: Performed at [facility], [address] [place],  [address]  Gastrointestinal Panel by PCR , Stool     Status: None   Collection Time: 07/23/22  9:53 AM   Specimen: Stool  Result Value Ref Range Status   Campylobacter species NOT DETECTED NOT DETECTED Final   Plesimonas shigelloides NOT DETECTED NOT DETECTED Final   Salmonella species NOT DETECTED NOT DETECTED Final   Yersinia enterocolitica NOT DETECTED NOT DETECTED Final   Vibrio species NOT DETECTED NOT DETECTED Final   Vibrio cholerae NOT DETECTED NOT DETECTED Final   Enteroaggregative E coli (EAEC) NOT DETECTED NOT DETECTED Final   Enteropathogenic E coli (EPEC) NOT DETECTED NOT DETECTED Final   Enterotoxigenic E coli (ETEC) NOT DETECTED NOT DETECTED Final   Shiga like toxin producing E coli (STEC) NOT DETECTED NOT  DETECTED Final   Shigella/Enteroinvasive E coli (EIEC) NOT DETECTED NOT DETECTED Final   Cryptosporidium NOT DETECTED NOT DETECTED Final   Cyclospora cayetanensis NOT DETECTED NOT DETECTED Final   Entamoeba histolytica NOT DETECTED NOT DETECTED  Final   Giardia lamblia NOT DETECTED NOT DETECTED Final   Adenovirus F40/41 NOT DETECTED NOT DETECTED Final   Astrovirus NOT DETECTED NOT DETECTED Final   Norovirus GI/GII NOT DETECTED NOT DETECTED Final   Rotavirus A NOT DETECTED NOT DETECTED Final   Sapovirus (I, II, IV, and V) NOT DETECTED NOT DETECTED Final    Comment: Performed at [facility], [address] [place], [place] [address]     Time coordinating discharge: Over 30 minutes  SIGNED:   Shawna Clamp, MD  [facility] 07/24/2022, 3:49 PM Pager   If 7PM-7AM, please contact night-coverage

## 2022-07-24 NOTE — Progress Notes (Signed)
  Transition of Care [facility]) Screening Note   Patient Details  Name: Kimberly Page Date of Birth: 06-29-1946   Transition of Care [facility]) CM/SW Contact:    Alberteen Sam, LCSW Phone Number: 07/24/2022, 9:01 AM    Transition of Care Department [facility]) has reviewed patient and no TOC needs have been identified at this time. We will continue to monitor patient advancement through interdisciplinary progression rounds. If new patient transition needs arise, please place a TOC consult.  [place], [place]

## 2022-07-25 DIAGNOSIS — I214 Non-ST elevation (NSTEMI) myocardial infarction: Secondary | ICD-10-CM | POA: Diagnosis not present

## 2022-07-25 NOTE — Progress Notes (Signed)
PROGRESS NOTE    Kimberly Page  TMH:[id number] DOB: 1946-10-08 DOA: 07/22/2022 PCP: Kimberly Aus, MD   Brief Narrative:  This 76 years old female with PMH significant for hypertension, hyperlipidemia, COPD, A-fib not on anticoagulation due to history of epistaxis, GERD, former smoker, hiatal hernia, s/p repair of paraesophageal hernia presented in the ED with complaints of chest pain associated with palpitations since last night.  Patient states that her PCP has recently adjusted her metoprolol dose, she describes chest pain is pressure-like, nonradiating, midsternal.  Patient was admitted for chest pain and rule out ACS.  Cardiology is consulted, echocardiogram completed, report shows LVEF 55 to 60%, no RWMA.  Patient did have a CTA chest in 2019 which showed normal coronary arteries.  As per cardiology chest pain appears atypical and can be discharged and follow-up 2 weeks with Kimberly Page.  Patient felt better,  denies any chest pain. Medications adjusted during hospitalization, Patient was discharged home yesterday.  She has an episode of palpitations associated with sweating, EKG was completed seems unchanged.  Troponin remained flat, 19> 24> 22.  Patient was kept overnight for observation.  She was seen next day,  remained stable, She  feels fine,  denies any more palpitations and wants to be discharged.  Patient is being discharged next day.  Assessment & Plan:   Principal Problem:   NSTEMI (non-ST elevated myocardial infarction) ([place]) Active Problems:   HTN (hypertension)   HLD (hyperlipidemia)   COPD (chronic obstructive pulmonary disease) (HCC)   PAF (paroxysmal atrial fibrillation) (HCC)   Hyponatremia   Diarrhea  Chest pain: Patient presented with chest pain,  nonradiating, pressure-like associated with palpitations. Troponin level 21, 55, 87,  Consulted Kimberly Page of cardiology Continue aspirin, Lipitor. As needed nitroglycerin and morphine Echocardiogram completed, LVEF 55  to 60% no RWMA Chest pain has resolved, appears atypical. Patient did have CTA chest in 2019 which showed normal coronary arteries. Chest pain resolved.  Patient was not discharged yesterday.  She has developed palpitations. Patient being discharged home today.   Essential hypertension: -Continue home lisinopril, metoprolol -Hold HCTZ due to hyponatremia.    HLD (hyperlipidemia) Continue Lipitor   COPD (chronic obstructive pulmonary disease) (HCC) Stable. Continue Bronchodilators   PAF (paroxysmal atrial fibrillation) ([place]) Patient is not taking anticoagulants. Continue Metoprolol   Hyponatremia > Resolved  Sodium 123, which improved to 135 with iv fluids. -Check osmolality of plasma and urine, and urine sodium -Hold HCTZ   Diarrhea C. difficile and GI panel negative. Diarrhea has resolved.   DVT prophylaxis: Lovenox Code Status: Full code Family Communication: No family at bedside Disposition Plan:   Status is: Observation The patient remains OBS appropriate and will d/c before 2 midnights.  Admitted for chest pain associated with palpitations.  Patient evaluated by cardiology echocardiogram reported unremarkable.  Patient was discharged yesterday but she did not leave due to having palpitations which is now resolved..  Anticipated discharge home 07/25/2022.  Consultants:  Cardiology  Procedures: Echocardiogram Antimicrobials: None  Subjective: Patient was seen and examined at bedside.  Overnight events noted.   Patient reports feeling much improved. Chest pain has resolved.  Echocardiogram is unremarkable.   Patient wants to be discharged today.  Objective: Vitals:   07/24/22 1941 07/24/22 2318 07/25/22 0446 07/25/22 0735  BP: 131/83 (!) 122/54 133/81 135/73  Pulse: 76 (!) 51 (!) 55 (!) 52  Resp: '20 20 20   '$ Temp: 98 F (36.7 C) 97.7 F (36.5 C) 97.9 F (36.6 C)  98 F (36.7 C)  TempSrc: Oral Oral Oral   SpO2: 98% 100% 99% 99%  Weight:      Height:         Intake/Output Summary (Last 24 hours) at 07/25/2022 1129 Last data filed at 07/24/2022 1840 Gross per 24 hour  Intake 480 ml  Output --  Net 480 ml   Filed Weights   07/22/22 0051 07/23/22 1631  Weight: 68 kg 67 kg    Examination:  General exam: Appears comfortable, not in any acute distress.  Deconditioned Respiratory system: Clear to auscultation bilaterally, no wheezing, no crackles, normal respiratory effort. Cardiovascular system: S1 & S2 heard, regular rate and rhythm, no murmur.   Gastrointestinal system: Abdomen is soft, non tender, non distended, BS +, Central nervous system: Alert and oriented x 3 . No focal neurological deficits. Extremities: No edema, no cyanosis, no clubbing. Skin: No rashes, lesions or ulcers Psychiatry: Judgement and insight appear normal. Mood & affect appropriate.     Data Reviewed: I have personally reviewed following labs and imaging studies  CBC: Recent Labs  Lab 07/22/22 0057  WBC 8.2  HGB 14.6  HCT 41.1  MCV 85.3  PLT 263   Basic Metabolic Panel: Recent Labs  Lab 07/22/22 0057 07/22/22 0830 07/22/22 1308 07/23/22 0531  NA 123* 130* 128* 135  K 3.8 3.9 3.9 4.3  CL 87* 96* 95* 102  CO2 '23 26 25 26  '$ GLUCOSE 159* 112* 149* 97  BUN '13 10 10 14  '$ CREATININE 0.74 0.66 0.80 0.64  CALCIUM 9.6 8.5* 8.4* 8.0*   GFR: Estimated Creatinine Clearance: 57.2 mL/min (by C-G formula based on SCr of 0.64 mg/dL). Liver Function Tests: No results for input(s): "AST", "ALT", "ALKPHOS", "BILITOT", "PROT", "ALBUMIN" in the last 168 hours. No results for input(s): "LIPASE", "AMYLASE" in the last 168 hours. No results for input(s): "AMMONIA" in the last 168 hours. Coagulation Profile: Recent Labs  Lab 07/22/22 0057  INR 1.0   Cardiac Enzymes: No results for input(s): "CKTOTAL", "CKMB", "CKMBINDEX", "TROPONINI" in the last 168 hours. BNP (last 3 results) No results for input(s): "PROBNP" in the last 8760 hours. HbA1C: No results for  input(s): "HGBA1C" in the last 72 hours.  CBG: No results for input(s): "GLUCAP" in the last 168 hours. Lipid Profile: Recent Labs    07/23/22 0531  CHOL 191  HDL 54  LDLCALC 121*  TRIG 82  CHOLHDL 3.5   Thyroid Function Tests: No results for input(s): "TSH", "T4TOTAL", "FREET4", "T3FREE", "THYROIDAB" in the last 72 hours.  Anemia Panel: No results for input(s): "VITAMINB12", "FOLATE", "FERRITIN", "TIBC", "IRON", "RETICCTPCT" in the last 72 hours. Sepsis Labs: No results for input(s): "PROCALCITON", "LATICACIDVEN" in the last 168 hours.  Recent Results (from the past 240 hour(s))  SARS Coronavirus 2 by RT PCR (hospital order, performed in [facility] hospital lab) *cepheid single result test* Anterior Nasal Swab     Status: None   Collection Time: 07/22/22  1:51 AM   Specimen: Anterior Nasal Swab  Result Value Ref Range Status   SARS Coronavirus 2 by RT PCR NEGATIVE NEGATIVE Final    Comment: (NOTE) SARS-CoV-2 target nucleic acids are NOT DETECTED.  The SARS-CoV-2 RNA is generally detectable in upper and lower respiratory specimens during the acute phase of infection. The lowest concentration of SARS-CoV-2 viral copies this assay can detect is 250 copies / mL. A negative result does not preclude SARS-CoV-2 infection and should not be used as the sole basis for treatment or  other patient management decisions.  A negative result may occur with improper specimen collection / handling, submission of specimen other than nasopharyngeal swab, presence of viral mutation(s) within the areas targeted by this assay, and inadequate number of viral copies (<250 copies / mL). A negative result must be combined with clinical observations, patient history, and epidemiological information.  Fact Sheet for Patients:   https://www.patel.info/  Fact Sheet for Healthcare Providers: https://hall.com/  This test is not yet approved or  cleared by  the [place] FDA and has been authorized for detection and/or diagnosis of SARS-CoV-2 by FDA under an Emergency Use Authorization (EUA).  This EUA will remain in effect (meaning this test can be used) for the duration of the COVID-19 declaration under Section 564(b)(1) of the Act, 21 U.S.C. section 360bbb-3(b)(1), unless the authorization is terminated or revoked sooner.  Performed at [facility], [place], [place] [address]   C Difficile Quick Screen w PCR reflex     Status: None   Collection Time: 07/23/22  9:52 AM   Specimen: STOOL  Result Value Ref Range Status   C Diff antigen NEGATIVE NEGATIVE Final   C Diff toxin NEGATIVE NEGATIVE Final   C Diff interpretation No C. difficile detected.  Final    Comment: Performed at [facility], [address] [place], [place] [address]  Gastrointestinal Panel by PCR , Stool     Status: None   Collection Time: 07/23/22  9:53 AM   Specimen: Stool  Result Value Ref Range Status   Campylobacter species NOT DETECTED NOT DETECTED Final   Plesimonas shigelloides NOT DETECTED NOT DETECTED Final   Salmonella species NOT DETECTED NOT DETECTED Final   Yersinia enterocolitica NOT DETECTED NOT DETECTED Final   Vibrio species NOT DETECTED NOT DETECTED Final   Vibrio cholerae NOT DETECTED NOT DETECTED Final   Enteroaggregative E coli (EAEC) NOT DETECTED NOT DETECTED Final   Enteropathogenic E coli (EPEC) NOT DETECTED NOT DETECTED Final   Enterotoxigenic E coli (ETEC) NOT DETECTED NOT DETECTED Final   Shiga like toxin producing E coli (STEC) NOT DETECTED NOT DETECTED Final   Shigella/Enteroinvasive E coli (EIEC) NOT DETECTED NOT DETECTED Final   Cryptosporidium NOT DETECTED NOT DETECTED Final   Cyclospora cayetanensis NOT DETECTED NOT DETECTED Final   Entamoeba histolytica NOT DETECTED NOT DETECTED Final   Giardia lamblia NOT DETECTED NOT DETECTED Final   Adenovirus F40/41 NOT DETECTED NOT DETECTED Final    Astrovirus NOT DETECTED NOT DETECTED Final   Norovirus GI/GII NOT DETECTED NOT DETECTED Final   Rotavirus A NOT DETECTED NOT DETECTED Final   Sapovirus (I, II, IV, and V) NOT DETECTED NOT DETECTED Final    Comment: Performed at [facility], [address], [place], [place] [address]    Radiology Studies: No results found.  Scheduled Meds:  aspirin EC  81 mg Oral Daily   atorvastatin  40 mg Oral Daily   calcium-vitamin D  1 tablet Oral Q1400   vitamin B-12  500 mcg Oral Daily   enoxaparin (LOVENOX) injection  40 mg Subcutaneous Q24H   fluticasone  2 spray Each Nare Daily   lisinopril  10 mg Oral Daily   loratadine  10 mg Oral BH-q7a   magnesium oxide  400 mg Oral BH-q7a   metoprolol succinate  37.5 mg Oral BID   montelukast  10 mg Oral Daily   oxybutynin  5 mg Oral BID   vitamin E  200 Units Oral Once  per day on Sun Tue Thu Sat   Continuous Infusions:   LOS: 0 days    Time spent: 35 mins    Shawna Clamp, MD [facility]   If 7PM-7AM, please contact night-coverage

## 2022-08-04 ENCOUNTER — Other Ambulatory Visit: Payer: Self-pay | Admitting: Internal Medicine

## 2022-08-04 DIAGNOSIS — R0789 Other chest pain: Secondary | ICD-10-CM

## 2022-08-04 DIAGNOSIS — R0602 Shortness of breath: Secondary | ICD-10-CM

## 2022-08-12 ENCOUNTER — Ambulatory Visit
Admission: RE | Admit: 2022-08-12 | Discharge: 2022-08-12 | Disposition: A | Discharge status: Home / Self Care | Payer: Medicare Other | Source: Ambulatory Visit | Attending: Internal Medicine | Admitting: Internal Medicine

## 2022-08-12 DIAGNOSIS — R0602 Shortness of breath: Secondary | ICD-10-CM

## 2022-08-12 DIAGNOSIS — R0789 Other chest pain: Secondary | ICD-10-CM

## 2022-08-12 MED ORDER — IOPAMIDOL (ISOVUE-370) INJECTION 76%
75.0000 mL | Freq: Once | INTRAVENOUS | Status: AC | PRN
  Administered 2022-08-12: 75 mL via INTRAVENOUS

## 2022-11-28 ENCOUNTER — Other Ambulatory Visit: Payer: Self-pay | Admitting: Internal Medicine

## 2022-11-28 DIAGNOSIS — Z1231 Encounter for screening mammogram for malignant neoplasm of breast: Secondary | ICD-10-CM

## 2022-12-31 ENCOUNTER — Ambulatory Visit
Admission: RE | Admit: 2022-12-31 | Discharge: 2022-12-31 | Disposition: A | Discharge status: Home / Self Care | Payer: Medicare Other | Source: Ambulatory Visit | Attending: Internal Medicine | Admitting: Internal Medicine

## 2022-12-31 DIAGNOSIS — Z1231 Encounter for screening mammogram for malignant neoplasm of breast: Secondary | ICD-10-CM | POA: Diagnosis not present

## 2023-03-13 ENCOUNTER — Other Ambulatory Visit: Payer: Self-pay

## 2023-03-13 ENCOUNTER — Emergency Department
Admission: EM | Admit: 2023-03-13 | Discharge: 2023-03-13 | Disposition: A | Discharge status: Home / Self Care | Payer: Medicare Other | Attending: Emergency Medicine | Admitting: Emergency Medicine

## 2023-03-13 ENCOUNTER — Emergency Department: Payer: Medicare Other

## 2023-03-13 DIAGNOSIS — R0789 Other chest pain: Secondary | ICD-10-CM | POA: Insufficient documentation

## 2023-03-13 DIAGNOSIS — I159 Secondary hypertension, unspecified: Secondary | ICD-10-CM | POA: Insufficient documentation

## 2023-03-13 DIAGNOSIS — J449 Chronic obstructive pulmonary disease, unspecified: Secondary | ICD-10-CM | POA: Diagnosis not present

## 2023-03-13 DIAGNOSIS — R079 Chest pain, unspecified: Secondary | ICD-10-CM

## 2023-03-13 LAB — CBC
HCT: 41.1 % (ref 36.0–46.0)
Hemoglobin: 13.5 g/dL (ref 12.0–15.0)
MCH: 30 pg (ref 26.0–34.0)
MCHC: 32.8 g/dL (ref 30.0–36.0)
MCV: 91.3 fL (ref 80.0–100.0)
Platelets: 258 10*3/uL (ref 150–400)
RBC: 4.5 MIL/uL (ref 3.87–5.11)
RDW: 12 % (ref 11.5–15.5)
WBC: 6.4 10*3/uL (ref 4.0–10.5)
nRBC: 0 % (ref 0.0–0.2)

## 2023-03-13 LAB — BASIC METABOLIC PANEL
Anion gap: 8 (ref 5–15)
BUN: 18 mg/dL (ref 8–23)
CO2: 29 mmol/L (ref 22–32)
Calcium: 8.9 mg/dL (ref 8.9–10.3)
Chloride: 98 mmol/L (ref 98–111)
Creatinine, Ser: 0.58 mg/dL (ref 0.44–1.00)
GFR, Estimated: >60 mL/min (ref >60)
Glucose, Bld: 102 mg/dL — ABNORMAL HIGH (ref 70–99)
Potassium: 4.3 mmol/L (ref 3.5–5.1)
Sodium: 135 mmol/L (ref 135–145)

## 2023-03-13 LAB — TROPONIN I (HIGH SENSITIVITY)
Troponin I (High Sensitivity): 5 ng/L (ref <18)
Troponin I (High Sensitivity): 5 ng/L (ref <18)

## 2023-03-13 NOTE — ED Provider Notes (Signed)
[facility] Provider Note    Event Date/Time   First MD Initiated Contact with Patient 03/13/23 2111     (approximate)   History   Hypertension   HPI  Kimberly Page is a 77 y.o. female past medical history significant for COPD, hyperlipidemia, atrial fibrillation not on anticoagulation, presents to the emergency department with elevated blood pressure.  Patient states that she has been checking her blood pressure this week and it has been elevated.  States that she does not have a history of elevated blood pressure in the past and only takes metoprolol for history of fast heartbeats.  States that she has had intermittent episodes of chest discomfort this week that she describes as a pressure sensation.  Denies any chest pain at this time.  No exertional chest pain.  States that she sometimes has some exertional shortness of breath.  Denies any chest pain or shortness of breath today.  Denies any nausea or vomiting.  No history of DVT or PE.     Physical Exam   Triage Vital Signs: ED Triage Vitals  Enc Vitals Group     BP 03/13/23 1911 (!) 198/100     Pulse Rate 03/13/23 1911 60     Resp 03/13/23 1911 17     Temp 03/13/23 1911 97.9 F (36.6 C)     Temp Source 03/13/23 1911 Oral     SpO2 03/13/23 1911 97 %     Weight 03/13/23 1912 149 lb (67.6 kg)     Height 03/13/23 1912 5\' 4"  (1.626 m)     Head Circumference --      Peak Flow --      Pain Score 03/13/23 1912 0     Pain Loc --      Pain Edu? --      Excl. in Industry? --     Most recent vital signs: Vitals:   03/13/23 2130 03/13/23 2245  BP: (!) 171/75 (!) 156/60  Pulse: 69 (!) 54  Resp: 20 13  Temp:    SpO2: 96% 97%    Physical Exam Constitutional:      Appearance: She is well-developed.  HENT:     Head: Atraumatic.  Eyes:     Conjunctiva/sclera: Conjunctivae normal.  Cardiovascular:     Rate and Rhythm: Regular rhythm.  Pulmonary:     Effort: No respiratory distress.  Abdominal:      General: There is no distension.  Musculoskeletal:        General: Normal range of motion.     Cervical back: Normal range of motion.     Right lower leg: No edema.     Left lower leg: No edema.  Skin:    General: Skin is warm.  Neurological:     Mental Status: She is alert. Mental status is at baseline.     IMPRESSION / MDM / ASSESSMENT AND PLAN / ED COURSE  I reviewed the triage vital signs and the nursing notes.  Differential diagnosis including hypertensive emergency, ACS, electrolyte abnormality, anxiety, pneumonia, dissection, CKD  On chart review patient does have a history of hypertension.  On review of her last discharge summary from her primary care physician patient was supposed to be taking lisinopril.  Also has a history of atrial fibrillation and does not take any anticoagulation as reported from epistaxis.  States that she has a follow-up appointment with cardiology with Dr. Clayborn Bigness however this is not scheduled until May.   EKG  Deeann Saint, the attending physician, personally viewed and interpreted this ECG.   Rate: Normal  Rhythm: Normal sinus  Axis: Normal  Intervals: Left bundle branch block  ST&T Change: None New left bundle branch block when compared to prior EKG  No tachycardic or bradycardic dysrhythmias while on cardiac telemetry.  RADIOLOGY I independently reviewed imaging, my interpretation of imaging: Chest x-ray with no focal findings.  Read as no acute findings.  LABS (all labs ordered are listed, but only abnormal results are displayed) Labs interpreted as -    Labs Reviewed  BASIC METABOLIC PANEL - Abnormal; Notable for the following components:      Result Value   Glucose, Bld 102 (*)    All other components within normal limits  CBC  TROPONIN I (HIGH SENSITIVITY)  TROPONIN I (HIGH SENSITIVITY)      MDM  Serial troponins are negative.  No chest pain while in the emergency department.  Patient's blood pressure improved  without any intervention to [id number] systolic.  No signs of endorgan damage.  Clinical picture is not consistent with hypertensive emergency.  No significant electrolyte abnormalities.  Low suspicion for dissection or pulmonary embolism.  Patient does appear to have a history of hypertension however she has not been taking any antihypertensive medications.  Discussed with cardiology given her new left bundle branch block, Dr. Curt Bears, recommended close follow-up as an outpatient with cardiology.  Placing a referral for urgent follow-up.  Given return precautions for any recurrence of chest pain.  Discussed close follow-up with primary care physician and discussed that she had been prescribed an antihypertensive medication in the past.  Patient states that she will follow-up with her primary care provider and does not want to restart an antihypertensive medication at this time until she follows up.     PROCEDURES:  Critical Care performed: No  Procedures  Patient's presentation is most consistent with acute presentation with potential threat to life or bodily function.   MEDICATIONS ORDERED IN ED: Medications - No data to display  FINAL CLINICAL IMPRESSION(S) / ED DIAGNOSES   Final diagnoses:  Chest pain, unspecified type  Secondary hypertension     Rx / DC Orders   ED Discharge Orders          Ordered    Ambulatory referral to Cardiology       Comments: If you have not heard from the Cardiology office within the next 72 hours please call [phone].   03/13/23 2227             Note:  This document was prepared using Dragon voice recognition software and may include unintentional dictation errors.   Nathaniel Man, MD 03/13/23 2325

## 2023-03-13 NOTE — ED Triage Notes (Signed)
Patient arrived from the walk-in cinic for an abnormal EKG. Patient went to the walk-in clinic for having high blood pressure and tightness in her chest. Patient has a history of palpitations and takes metoprolol for it. Patient does not take any other medication to manage blood pressure. BP in triage was 198/100.

## 2023-03-13 NOTE — Discharge Instructions (Signed)
You are seen in the emergency department for chest pain and high blood pressure.  You had 2 heart enzymes that were normal, do not believe that you are having a heart attack.  Discussed with the on-call cardiologist who recommended close follow-up with cardiology.  Cardiology referral was placed and he should receive a phone call.  I do not give them a phone call first thing Monday morning to schedule close follow-up.  If you have return of your chest pain is importantly return to the emergency department for reevaluation.  Take your blood pressure medications as prescribed.

## 2023-03-19 ENCOUNTER — Ambulatory Visit: Payer: Medicare Other | Admitting: Physical Therapy

## 2023-03-23 ENCOUNTER — Ambulatory Visit: Payer: Medicare Other | Admitting: Physical Therapy

## 2023-03-26 ENCOUNTER — Ambulatory Visit: Payer: Medicare Other | Admitting: Physical Therapy

## 2023-03-31 ENCOUNTER — Other Ambulatory Visit: Payer: Self-pay | Admitting: Internal Medicine

## 2023-03-31 DIAGNOSIS — I2089 Other forms of angina pectoris: Secondary | ICD-10-CM

## 2023-03-31 DIAGNOSIS — I447 Left bundle-branch block, unspecified: Secondary | ICD-10-CM

## 2023-04-01 ENCOUNTER — Ambulatory Visit: Payer: Medicare Other | Admitting: Physical Therapy

## 2023-04-02 ENCOUNTER — Ambulatory Visit: Payer: Medicare Other | Admitting: Physical Therapy

## 2023-04-15 ENCOUNTER — Telehealth (HOSPITAL_COMMUNITY): Payer: Self-pay | Admitting: *Deleted

## 2023-04-15 NOTE — Telephone Encounter (Signed)
Reaching out to patient to offer assistance regarding upcoming cardiac imaging study; pt verbalizes understanding of appt date/time, parking situation and where to check in, pre-test NPO status and verified current allergies; name and call back number provided for further questions should they arise  Larey Brick RN Navigator Cardiac Imaging [facility] [phone] office [phone] cell  Patient to take her daily medications. Confirms her HR is less than 60bpm.

## 2023-04-16 ENCOUNTER — Ambulatory Visit
Admission: RE | Admit: 2023-04-16 | Discharge: 2023-04-16 | Disposition: A | Discharge status: Home / Self Care | Payer: Medicare Other | Source: Ambulatory Visit | Attending: Internal Medicine | Admitting: Internal Medicine

## 2023-04-16 DIAGNOSIS — I447 Left bundle-branch block, unspecified: Secondary | ICD-10-CM | POA: Diagnosis not present

## 2023-04-16 DIAGNOSIS — I2089 Other forms of angina pectoris: Secondary | ICD-10-CM | POA: Insufficient documentation

## 2023-04-16 MED ORDER — NITROGLYCERIN 0.4 MG SL SUBL
0.8000 mg | SUBLINGUAL_TABLET | Freq: Once | SUBLINGUAL | Status: AC
  Administered 2023-04-16: 0.8 mg via SUBLINGUAL

## 2023-04-16 MED ORDER — IOHEXOL 350 MG/ML SOLN
75.0000 mL | Freq: Once | INTRAVENOUS | Status: AC | PRN
  Administered 2023-04-16: 75 mL via INTRAVENOUS

## 2023-04-16 NOTE — Progress Notes (Signed)
Patient tolerated procedure well. Ambulate w/o difficulty. Denies any lightheadedness or being dizzy. Pt denies any pain at this time. Sitting in chair, pt is encouraged to drink additional water throughout the day and reason explained to patient. Patient verbalized understanding and all questions answered. ABC intact. No further needs at this time. Discharge from procedure area w/o issues.  

## 2023-04-23 ENCOUNTER — Encounter: Payer: Medicare Other | Admitting: Physical Therapy

## 2023-04-30 ENCOUNTER — Ambulatory Visit: Payer: Medicare Other | Admitting: Physical Therapy

## 2023-05-07 ENCOUNTER — Ambulatory Visit: Payer: Medicare Other | Admitting: Physical Therapy

## 2023-05-14 ENCOUNTER — Encounter: Payer: Medicare Other | Admitting: Physical Therapy

## 2023-05-21 ENCOUNTER — Encounter: Payer: Medicare Other | Admitting: Physical Therapy

## 2023-05-26 ENCOUNTER — Encounter: Payer: Medicare Other | Admitting: Physical Therapy

## 2023-06-02 ENCOUNTER — Encounter: Payer: Medicare Other | Admitting: Physical Therapy

## 2023-06-17 ENCOUNTER — Encounter: Payer: Medicare Other | Admitting: Physical Therapy

## 2023-06-18 ENCOUNTER — Encounter: Payer: Medicare Other | Admitting: Physical Therapy

## 2023-07-02 ENCOUNTER — Encounter: Payer: Medicare Other | Admitting: Physical Therapy

## 2023-07-09 ENCOUNTER — Encounter: Payer: Medicare Other | Admitting: Physical Therapy

## 2023-07-16 ENCOUNTER — Encounter: Payer: Medicare Other | Admitting: Physical Therapy

## 2023-08-20 ENCOUNTER — Ambulatory Visit: Payer: Medicare Other | Admitting: Physical Therapy

## 2023-08-27 ENCOUNTER — Encounter: Payer: Medicare Other | Admitting: Physical Therapy

## 2023-09-03 ENCOUNTER — Encounter: Payer: Medicare Other | Admitting: Physical Therapy

## 2023-09-10 ENCOUNTER — Encounter: Payer: Medicare Other | Admitting: Physical Therapy

## 2023-09-17 ENCOUNTER — Encounter: Payer: Medicare Other | Admitting: Physical Therapy

## 2023-09-24 ENCOUNTER — Encounter: Payer: Medicare Other | Admitting: Physical Therapy

## 2023-10-01 ENCOUNTER — Encounter: Payer: Medicare Other | Admitting: Physical Therapy

## 2023-10-08 ENCOUNTER — Encounter: Payer: Medicare Other | Admitting: Physical Therapy

## 2023-10-15 ENCOUNTER — Encounter: Payer: Medicare Other | Admitting: Physical Therapy

## 2023-10-20 ENCOUNTER — Other Ambulatory Visit: Payer: Self-pay | Admitting: Internal Medicine

## 2023-10-20 DIAGNOSIS — Z1231 Encounter for screening mammogram for malignant neoplasm of breast: Secondary | ICD-10-CM

## 2023-10-22 ENCOUNTER — Encounter: Payer: Medicare Other | Admitting: Physical Therapy

## 2024-01-06 ENCOUNTER — Ambulatory Visit
Admission: RE | Admit: 2024-01-06 | Discharge: 2024-01-06 | Disposition: A | Discharge status: Home / Self Care | Payer: Medicare Other | Source: Ambulatory Visit | Attending: Internal Medicine | Admitting: Internal Medicine

## 2024-01-06 DIAGNOSIS — Z1231 Encounter for screening mammogram for malignant neoplasm of breast: Secondary | ICD-10-CM | POA: Insufficient documentation

## 2024-08-03 ENCOUNTER — Other Ambulatory Visit: Payer: Self-pay | Admitting: Internal Medicine

## 2024-08-03 DIAGNOSIS — R053 Chronic cough: Secondary | ICD-10-CM

## 2024-08-03 DIAGNOSIS — R634 Abnormal weight loss: Secondary | ICD-10-CM

## 2024-08-03 DIAGNOSIS — J431 Panlobular emphysema: Secondary | ICD-10-CM

## 2024-08-08 ENCOUNTER — Ambulatory Visit
Admission: RE | Admit: 2024-08-08 | Discharge: 2024-08-08 | Disposition: A | Discharge status: Home / Self Care | Source: Ambulatory Visit | Attending: Internal Medicine | Admitting: Internal Medicine

## 2024-08-08 DIAGNOSIS — J431 Panlobular emphysema: Secondary | ICD-10-CM | POA: Insufficient documentation

## 2024-08-08 DIAGNOSIS — R634 Abnormal weight loss: Secondary | ICD-10-CM | POA: Diagnosis present

## 2024-08-08 DIAGNOSIS — R053 Chronic cough: Secondary | ICD-10-CM | POA: Insufficient documentation

## 2024-08-08 MED ORDER — IOHEXOL 300 MG/ML  SOLN
75.0000 mL | Freq: Once | INTRAMUSCULAR | Status: AC | PRN
  Administered 2024-08-08: 75 mL via INTRAVENOUS

## 2024-11-07 ENCOUNTER — Other Ambulatory Visit: Payer: Self-pay | Admitting: Internal Medicine

## 2024-11-07 DIAGNOSIS — Z1231 Encounter for screening mammogram for malignant neoplasm of breast: Secondary | ICD-10-CM

## 2025-01-04 ENCOUNTER — Other Ambulatory Visit: Payer: Self-pay | Admitting: Pulmonary Disease

## 2025-01-04 DIAGNOSIS — J439 Emphysema, unspecified: Secondary | ICD-10-CM

## 2025-01-04 DIAGNOSIS — T17500A Unspecified foreign body in bronchus causing asphyxiation, initial encounter: Secondary | ICD-10-CM

## 2025-01-04 DIAGNOSIS — R918 Other nonspecific abnormal finding of lung field: Secondary | ICD-10-CM

## 2025-01-04 DIAGNOSIS — J189 Pneumonia, unspecified organism: Secondary | ICD-10-CM

## 2025-01-06 ENCOUNTER — Ambulatory Visit
Admission: RE | Admit: 2025-01-06 | Discharge: 2025-01-06 | Disposition: A | Discharge status: Home / Self Care | Source: Ambulatory Visit | Attending: Pulmonary Disease | Admitting: Pulmonary Disease

## 2025-01-06 DIAGNOSIS — J984 Other disorders of lung: Secondary | ICD-10-CM | POA: Diagnosis not present

## 2025-01-06 DIAGNOSIS — J189 Pneumonia, unspecified organism: Secondary | ICD-10-CM

## 2025-01-06 DIAGNOSIS — T17500A Unspecified foreign body in bronchus causing asphyxiation, initial encounter: Secondary | ICD-10-CM

## 2025-01-06 DIAGNOSIS — K5730 Diverticulosis of large intestine without perforation or abscess without bleeding: Secondary | ICD-10-CM | POA: Insufficient documentation

## 2025-01-06 DIAGNOSIS — R918 Other nonspecific abnormal finding of lung field: Secondary | ICD-10-CM | POA: Insufficient documentation

## 2025-01-06 DIAGNOSIS — I7090 Unspecified atherosclerosis: Secondary | ICD-10-CM | POA: Diagnosis not present

## 2025-01-06 DIAGNOSIS — J439 Emphysema, unspecified: Secondary | ICD-10-CM

## 2025-01-06 LAB — GLUCOSE, CAPILLARY: Glucose-Capillary: 85 mg/dL (ref 70–99)

## 2025-01-06 MED ORDER — FLUDEOXYGLUCOSE F - 18 (FDG) INJECTION
7.4800 | Freq: Once | INTRAVENOUS | Status: AC | PRN
  Administered 2025-01-06: 7.48 via INTRAVENOUS

## 2025-01-09 ENCOUNTER — Ambulatory Visit
Admission: RE | Admit: 2025-01-09 | Discharge: 2025-01-09 | Disposition: A | Source: Ambulatory Visit | Attending: Internal Medicine | Admitting: Internal Medicine

## 2025-01-09 DIAGNOSIS — Z1231 Encounter for screening mammogram for malignant neoplasm of breast: Secondary | ICD-10-CM | POA: Diagnosis present

## 2025-01-18 ENCOUNTER — Encounter
Admission: RE | Admit: 2025-01-18 | Discharge: 2025-01-18 | Disposition: A | Discharge status: Home / Self Care | Source: Ambulatory Visit | Attending: Pulmonary Disease | Admitting: Pulmonary Disease

## 2025-01-18 ENCOUNTER — Encounter: Payer: Self-pay | Admitting: Urgent Care

## 2025-01-18 ENCOUNTER — Other Ambulatory Visit: Payer: Self-pay | Admitting: Pulmonary Disease

## 2025-01-18 DIAGNOSIS — Z01818 Encounter for other preprocedural examination: Secondary | ICD-10-CM | POA: Diagnosis present

## 2025-01-18 DIAGNOSIS — I10 Essential (primary) hypertension: Secondary | ICD-10-CM | POA: Insufficient documentation

## 2025-01-18 DIAGNOSIS — T17500A Unspecified foreign body in bronchus causing asphyxiation, initial encounter: Secondary | ICD-10-CM

## 2025-01-18 DIAGNOSIS — I214 Non-ST elevation (NSTEMI) myocardial infarction: Secondary | ICD-10-CM | POA: Diagnosis not present

## 2025-01-18 DIAGNOSIS — Z01810 Encounter for preprocedural cardiovascular examination: Secondary | ICD-10-CM | POA: Diagnosis not present

## 2025-01-18 DIAGNOSIS — R918 Other nonspecific abnormal finding of lung field: Secondary | ICD-10-CM

## 2025-01-18 DIAGNOSIS — I480 Paroxysmal atrial fibrillation: Secondary | ICD-10-CM | POA: Insufficient documentation

## 2025-01-18 HISTORY — DX: Left bundle-branch block, unspecified: I44.7

## 2025-01-18 HISTORY — DX: Other forms of dyspnea: R06.09

## 2025-01-19 ENCOUNTER — Encounter
Admission: RE | Admit: 2025-01-19 | Discharge: 2025-01-19 | Disposition: A | Discharge status: Home / Self Care | Source: Ambulatory Visit | Attending: Pulmonary Disease | Admitting: Pulmonary Disease

## 2025-01-19 ENCOUNTER — Other Ambulatory Visit: Payer: Self-pay

## 2025-01-19 DIAGNOSIS — Z01810 Encounter for preprocedural cardiovascular examination: Secondary | ICD-10-CM

## 2025-01-19 DIAGNOSIS — I214 Non-ST elevation (NSTEMI) myocardial infarction: Secondary | ICD-10-CM

## 2025-01-19 DIAGNOSIS — R918 Other nonspecific abnormal finding of lung field: Secondary | ICD-10-CM

## 2025-01-19 DIAGNOSIS — I10 Essential (primary) hypertension: Secondary | ICD-10-CM

## 2025-01-19 HISTORY — DX: Pneumonia, unspecified organism: J18.9

## 2025-01-19 MED ORDER — LACTATED RINGERS IV SOLN: INTRAVENOUS | Status: DC

## 2025-01-19 NOTE — Patient Instructions (Addendum)
 Your procedure is scheduled on: 01/20/25 Report to the Registration Desk on the 1st floor of the [facility]. To find out your arrival time, please call [phone] between 1PM - 3PM on: 01/19/25 If your arrival time is 6:00 am, do not arrive before that time as the [facility] entrance doors do not open until 6:00 am.  REMEMBER: Instructions that are not followed completely may result in serious medical risk, up to and including death; or upon the discretion of your surgeon and anesthesiologist your surgery may need to be rescheduled.  Do not eat food or drink any liquids after midnight the night before surgery.  No gum chewing or hard candies.  One week prior to surgery: Stop Anti-inflammatories (NSAIDS) such as etodolac (LODINE) ,Advil, Aleve, Ibuprofen, Motrin, Naproxen, Naprosyn and Aspirin  based products such as Excedrin, Goody's Powder, BC Powder.  Stop ANY OVER THE COUNTER supplements until after surgery.  You may continue to take Tylenol  if needed for pain up until the day of surgery.  ON THE DAY OF SURGERY ONLY TAKE THESE MEDICATIONS WITH SIPS OF WATER:  metoprolol  succinate (TOPROL -XL)   Use inhalers on the day of surgery and bring to the hospital.  No Alcohol for 24 hours before or after surgery.  No Smoking including e-cigarettes for 24 hours before surgery.  No chewable tobacco products for at least 6 hours before surgery.  No nicotine patches on the day of surgery.  Do not use any recreational drugs for at least a week (preferably 2 weeks) before your surgery.  Please be advised that the combination of cocaine and anesthesia may have negative outcomes, up to and including death. If you test positive for cocaine, your surgery will be cancelled.  On the morning of surgery brush your teeth with toothpaste and water, you may rinse your mouth with mouthwash if you wish. Do not swallow any toothpaste or mouthwash.  Do not wear jewelry, make-up, hairpins, clips or  nail polish.  For welded (permanent) jewelry: bracelets, anklets, waist bands, etc.  Please have this removed prior to surgery.  If it is not removed, there is a chance that hospital personnel will need to cut it off on the day of surgery.  Do not wear lotions, powders, or perfumes.   Do not shave body hair from the neck down 48 hours before surgery.  Contact lenses, hearing aids and dentures may not be worn into surgery.  Do not bring valuables to the hospital. [facility] is not responsible for any missing/lost belongings or valuables.   Notify your doctor if there is any change in your medical condition (cold, fever, infection).  Wear comfortable clothing (specific to your surgery type) to the hospital.  After surgery, you can help prevent lung complications by doing breathing exercises.  Take deep breaths and cough every 1-2 hours. Your doctor may order a device called an Incentive Spirometer to help you take deep breaths. If you are being admitted to the hospital overnight, leave your suitcase in the car. After surgery it may be brought to your room.  In case of increased patient census, it may be necessary for you, the patient, to continue your postoperative care in the Same Day Surgery department.  If you are being discharged the day of surgery, you will not be allowed to drive home. You will need a responsible individual to drive you home and stay with you for 24 hours after surgery.   If you are taking public transportation, you will need  to have a responsible individual with you.  Please call the Pre-admissions Testing Dept. at [phone] if you have any questions about these instructions.  Surgery Visitation Policy:  Patients having surgery or a procedure may have two visitors.  Children under the age of 35 must have an adult with them who is not the patient.  Inpatient Visitation:    Visiting hours are 7 a.m. to 8 p.m. Up to four visitors are allowed at one time  in a patient room. The visitors may rotate out with other people during the day.  One visitor age 74 or older may stay with the patient overnight and must be in the room by 8 p.m.   Merchandiser, Retail to address health-related social needs:  [email or web address]

## 2025-01-20 ENCOUNTER — Encounter
Admission: RE | Disposition: A | Discharge status: Home / Self Care | Payer: Self-pay | Source: Home / Self Care | Attending: Pulmonary Disease

## 2025-01-20 ENCOUNTER — Ambulatory Visit
Admission: RE | Admit: 2025-01-20 | Discharge: 2025-01-20 | Disposition: A | Discharge status: Home / Self Care | Attending: Pulmonary Disease | Admitting: Pulmonary Disease

## 2025-01-20 ENCOUNTER — Ambulatory Visit: Payer: Self-pay | Admitting: Urgent Care

## 2025-01-20 ENCOUNTER — Encounter: Payer: Self-pay | Admitting: Pulmonary Disease

## 2025-01-20 ENCOUNTER — Ambulatory Visit

## 2025-01-20 MED ORDER — ROCURONIUM BROMIDE 100 MG/10ML IV SOLN
INTRAVENOUS | Status: DC | PRN
  Administered 2025-01-20: 10 mg via INTRAVENOUS
  Administered 2025-01-20: 30 mg via INTRAVENOUS

## 2025-01-20 MED ORDER — FENTANYL CITRATE (PF) 100 MCG/2ML IJ SOLN: 25.0000 ug | INTRAMUSCULAR | Status: DC | PRN

## 2025-01-20 MED ORDER — LIDOCAINE HCL (CARDIAC) PF 100 MG/5ML IV SOSY
PREFILLED_SYRINGE | INTRAVENOUS | Status: DC | PRN
  Administered 2025-01-20: 100 mg via INTRAVENOUS

## 2025-01-20 MED ORDER — DEXAMETHASONE SOD PHOSPHATE PF 10 MG/ML IJ SOLN
INTRAMUSCULAR | Status: DC | PRN
  Administered 2025-01-20: 10 mg via INTRAVENOUS

## 2025-01-20 MED ORDER — GLYCOPYRROLATE 0.2 MG/ML IJ SOLN
INTRAMUSCULAR | Status: DC | PRN
  Administered 2025-01-20: .2 mg via INTRAVENOUS

## 2025-01-20 MED ORDER — CHLORHEXIDINE GLUCONATE 0.12 % MT SOLN
OROMUCOSAL | Status: AC
  Filled 2025-01-20: qty 15

## 2025-01-20 MED ORDER — ONDANSETRON HCL 4 MG/2ML IJ SOLN
INTRAMUSCULAR | Status: DC | PRN
  Administered 2025-01-20 (×2): 4 mg via INTRAVENOUS

## 2025-01-20 MED ORDER — ESMOLOL HCL 100 MG/10ML IV SOLN
INTRAVENOUS | Status: DC | PRN
  Administered 2025-01-20 (×2): 30 mg via INTRAVENOUS

## 2025-01-20 MED ORDER — ACETAMINOPHEN 10 MG/ML IV SOLN
INTRAVENOUS | Status: AC
  Filled 2025-01-20: qty 200

## 2025-01-20 MED ORDER — LACTATED RINGERS IV SOLN: INTRAVENOUS | Status: DC | PRN

## 2025-01-20 MED ORDER — PROPOFOL 10 MG/ML IV BOLUS
INTRAVENOUS | Status: DC | PRN
  Administered 2025-01-20: 120 mg via INTRAVENOUS

## 2025-01-20 MED ORDER — DROPERIDOL 2.5 MG/ML IJ SOLN: 0.6250 mg | Freq: Once | INTRAMUSCULAR | Status: DC | PRN

## 2025-01-20 MED ORDER — FENTANYL CITRATE (PF) 100 MCG/2ML IJ SOLN
INTRAMUSCULAR | Status: AC
  Filled 2025-01-20: qty 2

## 2025-01-20 MED ORDER — SUGAMMADEX SODIUM 200 MG/2ML IV SOLN
INTRAVENOUS | Status: DC | PRN
  Administered 2025-01-20: 200 mg via INTRAVENOUS

## 2025-01-20 MED ORDER — HYDRALAZINE HCL 20 MG/ML IJ SOLN
INTRAMUSCULAR | Status: DC | PRN
  Administered 2025-01-20 (×2): 8 mg via INTRAVENOUS

## 2025-01-20 MED ORDER — ACETAMINOPHEN 10 MG/ML IV SOLN
INTRAVENOUS | Status: AC
  Filled 2025-01-20: qty 100

## 2025-01-20 MED ORDER — PROPOFOL 1000 MG/100ML IV EMUL
INTRAVENOUS | Status: AC
  Filled 2025-01-20: qty 100

## 2025-01-20 MED ORDER — ACETAMINOPHEN 10 MG/ML IV SOLN
INTRAVENOUS | Status: DC | PRN
  Administered 2025-01-20: 1000 mg via INTRAVENOUS

## 2025-01-20 MED ORDER — SUCCINYLCHOLINE CHLORIDE 200 MG/10ML IV SOSY
PREFILLED_SYRINGE | INTRAVENOUS | Status: DC | PRN
  Administered 2025-01-20: 100 mg via INTRAVENOUS

## 2025-01-20 NOTE — H&P (Signed)
 "    PULMONOLOGY         Date: 01/20/2025,   MRN# [id number] Kimberly Page 1946-01-19     AdmissionWeight: 61.2 kg                 CurrentWeight: 61.2 kg   CHIEF COMPLAINT:   Atypical pneumonia with mucus plugging   HISTORY OF PRESENT ILLNESS   This is a 80 year old female with a history of atrial fibrillation, osteoarthritis, carpal tunnel syndrome, COPD, GERD with recurrent atypical pneumonia and mucous plugging progressive dyspnea.  Recently came into the pulmonology outpatient clinic with ongoing symptoms and cough status post unintentional weight loss over 40 pounds.  We followed this up with CT chest imaging and noted tree-in-bud nodules scattered throughout the lungs most consistent with atypical infection possibly atypical mycobacterial infection.  There is also more conspicuous nodules throughout the lungs bilaterally.  PET scan was performed we reviewed these nodules together including a 1.3 cm pulmonary nodule with low-level uptake and interval stability from 2023 compatible with a benign etiology otherwise mild scarring likely associated with her previous COPD and overlying mucous plugging with possible atypical mycobacterial infection.  Patient is here today for bronchoscopic airway inspection, therapeutic aspiration of tracheobronchial tree for noted mucous alveolar lavage for microbiology for diagnosis of right neck indolent infection.  Reviewed risks/complications and benefits with patient, risks include infection, pneumothorax/pneumomediastinum which may require chest tube placement as well as overnight/prolonged hospitalization and possible mechanical ventilation. Other risks include bleeding and very rarely death.  Patient understands risks and wishes to proceed.  Additional questions were answered, and patient is aware that post procedure patient will be going home with family and may experience cough with possible clots on expectoration as well as phlegm which may last  few days as well as hoarseness of voice post intubation and mechanical ventilation.    PAST MEDICAL HISTORY   Past Medical History:  Diagnosis Date   A-fib [facility])    Abnormal chest x-ray with multiple lung nodules 05/2019   Arthritis    Carpal tunnel syndrome    Complication of anesthesia    Complication of anesthesia    after colonoscopy was sent [place] [facility] by [place], heart issues   COPD (chronic obstructive pulmonary disease) (HCC)    Dyspnea    Dyspnea on exertion    Family history of adverse reaction to anesthesia    pt unsure-mom died at a young age and unsure about dad   GERD (gastroesophageal reflux disease)    Hemangioma of liver    History of hiatal hernia    HTN (hypertension)    LBBB (left bundle branch block)    Pneumonia    PONV (postoperative nausea and vomiting) 1958   had eye surgery 5th grade and she got sick    Urinary incontinence    Vertigo      SURGICAL HISTORY   Past Surgical History:  Procedure Laterality Date   ABDOMINAL HYSTERECTOMY     BLADDER SUSPENSION  01/24/2008   BREAST BIOPSY Left 12/2004   core- neg   CATARACT EXTRACTION Left 06/14/2013   CATARACT EXTRACTION Right 06/28/2013   CHOLECYSTECTOMY  08/2008   COLONOSCOPY     COLONOSCOPY     DIAGNOSTIC LAPAROSCOPY     ESOPHAGOGASTRODUODENOSCOPY (EGD) WITH PROPOFOL  N/A 06/03/2019   Procedure: ESOPHAGOGASTRODUODENOSCOPY (EGD) WITH PROPOFOL ;  Surgeon: Gaylyn Gladis PENNER, MD;  Location: [facility] ENDOSCOPY;  Service: Endoscopy;  Laterality: N/A;   EYE SURGERY Right 1958   FOOT SURGERY  Right 09/07/2014   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 Foot still partially numb.  PINS   HEMORROIDECTOMY  11/1977   HERNIA REPAIR  01/24/2008   KNEE ARTHROSCOPY Right    KNEE SURGERY Left 12/1997   LARYNGOSCOPY  11/2005   reactive airway disease     seasonal allergies     TUBAL LIGATION     TYMPANOSTOMY TUBE PLACEMENT  1977   VAGINAL HYSTERECTOMY  04/1986     FAMILY HISTORY    Family History  Problem Relation Age of Onset   Hypertension Mother    Stroke Mother    Leukemia Father    Cancer Other    Breast cancer Paternal Aunt    Breast cancer Cousin      SOCIAL HISTORY   Social History[1]   MEDICATIONS    Home Medication:    Current Medication: Current Medications[2]    ALLERGIES   Naproxen, Citalopram, Propoxyphene, Verapamil, and Pseudoephedrine hcl     REVIEW OF SYSTEMS    Review of Systems:  Gen:  Denies  fever, sweats, chills weigh loss  HEENT: Denies blurred vision, double vision, ear pain, eye pain, hearing loss, nose bleeds, sore throat Cardiac:  No dizziness, chest pain or heaviness, chest tightness,edema Resp:   reports dyspnea chronically  Gi: Denies swallowing difficulty, stomach pain, nausea or vomiting, diarrhea, constipation, bowel incontinence Gu:  Denies bladder incontinence, burning urine Ext:   Denies Joint pain, stiffness or swelling Skin: Denies  skin rash, easy bruising or bleeding or hives Endoc:  Denies polyuria, polydipsia , polyphagia or weight change Psych:   Denies depression, insomnia or hallucinations   Other:  All other systems negative   VS: BP (!) 200/92   Pulse 61   Temp 97.8 F (36.6 C)   Resp 19   Ht 5' 4 (1.626 m)   Wt 61.2 kg   SpO2 99%   BMI 23.17 kg/m      PHYSICAL EXAM    GENERAL:NAD, no fevers, chills, no weakness no fatigue HEAD: Normocephalic, atraumatic.  EYES: Pupils equal, round, reactive to light. Extraocular muscles intact. No scleral icterus.  MOUTH: Moist mucosal membrane. Dentition intact. No abscess noted.  EAR, NOSE, THROAT: Clear without exudates. No external lesions.  NECK: Supple. No thyromegaly. No nodules. No JVD.  PULMONARY: decreased breath sounds with mild rhonchi worse at bases bilaterally.  CARDIOVASCULAR: S1 and S2. Regular rate and rhythm. No murmurs, rubs, or gallops. No edema. Pedal pulses 2+ bilaterally.  GASTROINTESTINAL: Soft, nontender,  nondistended. No masses. Positive bowel sounds. No hepatosplenomegaly.  MUSCULOSKELETAL: No swelling, clubbing, or edema. Range of motion full in all extremities.  NEUROLOGIC: Cranial nerves II through XII are intact. No gross focal neurological deficits. Sensation intact. Reflexes intact.  SKIN: No ulceration, lesions, rashes, or cyanosis. Skin warm and dry. Turgor intact.  PSYCHIATRIC: Mood, affect within normal limits. The patient is awake, alert and oriented x 3. Insight, judgment intact.       IMAGING    Final [99]   PACS Intelerad Image Link   Show images for NM PET Image Initial (PI) Skull Base To Thigh (F-18 FDG) Study Result  Narrative & Impression  EXAM: PET AND CT SKULL BASE TO MID THIGH 01/06/2025 10:08:51 AM   TECHNIQUE:   RADIOPHARMACEUTICAL: 7.48 mCi F-18 FDG Uptake time 60 minutes. Glucose level 85 mg/dl. Blood pool SUV 1.6.   PET imaging was acquired from the base of the skull to the mid thighs. Non-contrast enhanced computed tomography  was obtained for attenuation correction and anatomic localization.   COMPARISON: CT chest 08/08/2024.   CLINICAL HISTORY: Pulmonary nodules.   FINDINGS:   HEAD AND NECK: No metabolically active cervical lymphadenopathy.   CHEST: Volume loss in the lingula noted, maximum associated SUV 2.0, favoring benign etiology. A few scattered tiny nodules are for the most part below sensitive PET CT size thresholds. A larger nodule anteriorly in the right lower lobe measuring 1.3 x 0.8 cm on image 64 series 6 has a maximum SUV of 1.4 and similar size and morphology back through 05/04/2019, compatible with benign etiology. Scarring medially in the right middle lobe. Esophageal long segment mildly accentuated esophageal activity is likely physiologic. No metabolically active lymphadenopathy. Chest atherosclerosis. Mitral valve calcification.   ABDOMEN AND PELVIS: 2.8 x 2.2 cm exophytic lesion from the left kidney upper pole  anteriorly is photopenic, favoring a complex cyst, internal density 23 Hounsfield units. This is not felt to require further workup and is most compatible with a Bosniak category 2 cyst. Cholecystectomy. Sigmoid colon diverticulosis. Presumed physiologic activity in the colon. No metabolically active intraperitoneal mass. No metabolically active lymphadenopathy. Physiologic activity within the gastrointestinal and genitourinary systems.   BONES AND SOFT TISSUE: Degenerative facet activity on the left at L5-S1. Third atherosclerosis. No abnormal FDG activity localizes to the bones. No metabolically active aggressive osseous lesion.   IMPRESSION: 1. No findings suggestive of malignancy. 2. Lingular volume loss with mild uptake, favoring benign etiology. 3. Right lower lobe 1.3 x 0.8 cm pulmonary nodule with low uptake, stable since 05/04/2019, compatible with benign etiology. 4. Right middle lobe scarring. 5. Degenerative facet change at L5-S1 on the left. 6. Atherosclerosis and mitral valve calcification. 7. Sigmoid colon diverticulosis.   Electronically signed by: Ryan Salvage MD 01/06/2025 10:25 AM EST RP Workstation: [id number]      ASSESSMENT/PLAN   Bilateral mucous plugging with lung nodules Tree-in-bud nodularity noted throughout bilaterally.   We followed this up with CT chest imaging and noted tree-in-bud nodules scattered throughout the lungs most consistent with atypical infection possibly atypical mycobacterial infection.  There is also more conspicuous nodules throughout the lungs bilaterally.  PET scan was performed we reviewed these nodules together including a 1.3 cm pulmonary nodule with low-level uptake and interval stability from 2023 compatible with a benign etiology otherwise mild scarring likely associated with her previous COPD and overlying mucous plugging with possible atypical mycobacterial infection.  Patient is here today for bronchoscopic airway  inspection, therapeutic aspiration of tracheobronchial tree for noted mucous alveolar lavage for microbiology for diagnosis of right neck indolent infection. Reviewed risks/complications and benefits with patient, risks include infection, pneumothorax/pneumomediastinum which may require chest tube placement as well as overnight/prolonged hospitalization and possible mechanical ventilation. Other risks include bleeding and very rarely death.  Patient understands risks and wishes to proceed.  Additional questions were answered, and patient is aware that post procedure patient will be going home with family and may experience cough with possible clots on expectoration as well as phlegm which may last few days as well as hoarseness of voice post intubation and mechanical ventilation.        Thank you for allowing me to participate in the care of this patient.   Patient/Family are satisfied with care plan and all questions have been answered.    Provider disclosure: Patient with at least one acute or chronic illness or injury that poses a threat to life or bodily function and is being managed actively during this encounter.  All of the below services have been performed independently by signing provider:  review of prior documentation from internal and or external health records.  Review of previous and current lab results.  Interview and comprehensive assessment during patient visit today. Review of current and previous chest radiographs/CT scans. Discussion of management and test interpretation with health care team and patient/family.   This document was prepared using Dragon voice recognition software and may include unintentional dictation errors.     Ziyon Soltau, M.D.  Division of Pulmonary & Critical Care Medicine             [1]  Social History Tobacco Use   Smoking status: Former    Current packs/day: 0.00    Average packs/day: 1.5 packs/day for 45.0 years (67.5 ttl  pk-yrs)    Types: Cigarettes    Start date: 12/22/1954    Quit date: 12/23/1999    Years since quitting: 25.0   Smokeless tobacco: Never  Vaping Use   Vaping status: Never Used  Substance Use Topics   Alcohol use: Yes    Alcohol/week: 0.0 standard drinks of alcohol    Comment: wine occ   Drug use: No  [2]  Current Facility-Administered Medications:    lactated ringers  infusion, , Intravenous, Continuous, Mazzoni, Andrea, MD  "

## 2025-01-20 NOTE — Procedures (Signed)
" °  PROCEDURE: BRONCHOSCOPY Therapeutic Aspiration of Tracheobronchial Tree and Bronchoalveolar lavage  PROCEDURE DATE: 01/20/2025  TIME:  NAME:  Marlis Oldaker  DOB:1946-09-06  MRN: [id number] LOC:  [facility]/None    HOSP DAY: @LENGTHOFSTAYDAYS @ CODE STATUS:   Code Status History     Date Active Date Inactive Code Status Order ID Comments User Context   07/22/2022 0613 07/25/2022 1618 Full Code [id number]  Lawence Madison LABOR, MD ED   07/21/2019 1151 07/22/2019 2037 Full Code [id number]  Jordis Laneta FALCON, MD Inpatient           Indications/Preliminary Diagnosis:   Consent: (Place X beside choice/s below)  The benefits, risks and possible complications of the procedure were        explained to:  _x__ patient  ___ patient's family  ___ other:___________  who verbalized understanding and gave:  ___ verbal  __x_ written  ___ verbal and written  ___ telephone  ___ other:________ consent.      Unable to obtain consent; procedure performed on emergent basis.     Other:       PRESEDATION ASSESSMENT: History and Physical has been performed. Patient meds and allergies have been reviewed. Presedation airway examination has been performed and documented. Baseline vital signs, sedation score, oxygenation status, and cardiac rhythm were reviewed. Patient was deemed to be in satisfactory condition to undergo the procedure.      PROCEDURE DETAILS: Timeout performed and correct patient, name, & ID confirmed. Following prep per Pulmonary policy, appropriate sedation was administered. The Bronchoscope was inserted in to oral cavity with bite block in place. Therapeutic aspiration of Tracheobronchial tree was performed.  Airway exam proceeded with findings, technical procedures, and specimen collection as noted below. At the end of exam the scope was withdrawn without incident. Impression and Plan as noted below.     TECHNICAL PROCEDURES: (Place X beside choice below)   Procedures  Description    None      Electrocautery     Cryotherapy     Balloon Dilatation     Bronchography     Stent Placement   x  Therapeutic Aspiration bilaterally    Laser/Argon Plasma    Brachytherapy Catheter Placement    Foreign Body Removal         SPECIMENS (Sites): (Place X beside choice below)  Specimens Description   No Specimens Obtained     Washings   x Lavage Right middle lobe   Biopsies    Fine Needle Aspirates    Brushings    Sputum    FINDINGS:  Non-occlusive mucus plugging bilaterally, this was treated with therapeutic aspiration bilaterally to clear airways of phlegm/debri.  BAL was done at right middle lobe for microbiology ESTIMATED BLOOD LOSS: none COMPLICATIONS/RESOLUTION: none        Annice Jolly, M.D.  Pulmonary & Critical Care Medicine  [facility]    "

## 2025-01-20 NOTE — Anesthesia Preprocedure Evaluation (Signed)
"                                    Anesthesia Evaluation  Patient identified by MRN, date of birth, ID band Patient awake    Reviewed: Allergy & Precautions, H&P , NPO status , Patient's Chart, lab work & pertinent test results, reviewed documented beta blocker date and time   History of Anesthesia Complications (+) PONV, Family history of anesthesia reaction and history of anesthetic complications  Airway Mallampati: I  TM Distance: >3 FB Neck ROM: full    Dental  (+) Upper Dentures, Lower Dentures, Edentulous Upper, Edentulous Lower, Dental Advidsory Given   Pulmonary shortness of breath and with exertion, COPD, neg recent URI, former smoker          Cardiovascular Exercise Tolerance: Good hypertension, (-) angina (-) CAD, (-) Past MI, (-) Cardiac Stents and (-) CABG + dysrhythmias Atrial Fibrillation (-) Valvular Problems/Murmurs     Neuro/Psych negative neurological ROS  negative psych ROS   GI/Hepatic Neg liver ROS, hiatal hernia,GERD  ,,  Endo/Other  negative endocrine ROS    Renal/GU negative Renal ROS  negative genitourinary   Musculoskeletal   Abdominal   Peds  Hematology  (+) Blood dyscrasia, anemia   Anesthesia Other Findings Past Medical History: No date: A-fib (HCC) No date: Carpal tunnel syndrome No date: COPD (chronic obstructive pulmonary disease) (HCC) No date: Dysrhythmia     Comment:  atrial fibrillation No date: Fibrocystic breast disease in female No date: Hemangioma of liver No date: Hypertension No date: Urinary incontinence No date: Vertigo   Reproductive/Obstetrics negative OB ROS                              Anesthesia Physical Anesthesia Plan  ASA: 2  Anesthesia Plan: General   Post-op Pain Management:    Induction: Intravenous  PONV Risk Score and Plan: 3 and Treatment may vary due to age or medical condition, Propofol  infusion and TIVA  Airway Management Planned: Oral  ETT  Additional Equipment:   Intra-op Plan:   Post-operative Plan:   Informed Consent: I have reviewed the patients History and Physical, chart, labs and discussed the procedure including the risks, benefits and alternatives for the proposed anesthesia with the patient or authorized representative who has indicated his/her understanding and acceptance.     Dental Advisory Given  Plan Discussed with: Anesthesiologist, CRNA and Surgeon  Anesthesia Plan Comments:          Anesthesia Quick Evaluation  "

## 2025-01-20 NOTE — Anesthesia Procedure Notes (Signed)
 Procedure Name: Intubation Date/Time: 01/20/2025 7:34 AM  Performed by: Ledora Duncan, CRNAPre-anesthesia Checklist: Patient identified, Emergency Drugs available, Suction available and Patient being monitored Patient Re-evaluated:Patient Re-evaluated prior to induction Oxygen Delivery Method: Circle system utilized Preoxygenation: Pre-oxygenation with 100% oxygen Induction Type: IV induction Ventilation: Mask ventilation without difficulty Laryngoscope Size: McGrath and 3 Grade View: Grade I Tube type: Oral Tube size: 8.0 mm Number of attempts: 1 Airway Equipment and Method: Stylet Placement Confirmation: ETT inserted through vocal cords under direct vision, positive ETCO2 and breath sounds checked- equal and bilateral Secured at: 21 cm Tube secured with: Tape Dental Injury: Teeth and Oropharynx as per pre-operative assessment

## 2025-01-20 NOTE — Transfer of Care (Signed)
 Immediate Anesthesia Transfer of Care Note  Patient: Kimberly Page  Procedure(s) Performed: BRONCHOSCOPY, FLEXIBLE IRRIGATION, BRONCHUS  Patient Location: PACU  Anesthesia Type:General  Level of Consciousness: awake, drowsy, and patient cooperative  Airway & Oxygen Therapy: Patient Spontanous Breathing and Patient connected to face mask oxygen  Post-op Assessment: Report given to RN and Post -op Vital signs reviewed and stable  Post vital signs: Reviewed and stable  Last Vitals:  Vitals Value Taken Time  BP 157/73 01/20/25 07:51  Temp 36.6 C 01/20/25 07:51  Pulse 77 01/20/25 08:00  Resp 21 01/20/25 08:00  SpO2 97 % 01/20/25 08:00  Vitals shown include unfiled device data.  Last Pain:  Vitals:   01/20/25 0751  PainSc: Asleep         Complications: No notable events documented.

## 2025-01-22 LAB — CULTURE, BAL-QUANTITATIVE W GRAM STAIN
Culture: NO GROWTH
Gram Stain: NONE SEEN

## 2025-01-23 ENCOUNTER — Encounter: Payer: Self-pay | Admitting: Pulmonary Disease

## 2025-01-23 LAB — ACID FAST SMEAR (AFB, MYCOBACTERIA): Acid Fast Smear: NEGATIVE

## 2025-01-27 LAB — CULTURE, FUNGUS WITHOUT SMEAR
